# Patient Record
Sex: Female | Born: 1990
Health system: Southern US, Community
[De-identification: ages and names within clinical notes are randomized; demographics above are authoritative.]

## PROBLEM LIST (undated history)

## (undated) DIAGNOSIS — F32A Depression, unspecified: Secondary | ICD-10-CM

## (undated) DIAGNOSIS — I10 Essential (primary) hypertension: Secondary | ICD-10-CM

## (undated) DIAGNOSIS — M549 Dorsalgia, unspecified: Secondary | ICD-10-CM

## (undated) DIAGNOSIS — F909 Attention-deficit hyperactivity disorder, unspecified type: Secondary | ICD-10-CM

## (undated) DIAGNOSIS — F988 Other specified behavioral and emotional disorders with onset usually occurring in childhood and adolescence: Secondary | ICD-10-CM

## (undated) DIAGNOSIS — R7303 Prediabetes: Secondary | ICD-10-CM

## (undated) DIAGNOSIS — R0602 Shortness of breath: Secondary | ICD-10-CM

## (undated) DIAGNOSIS — M7989 Other specified soft tissue disorders: Secondary | ICD-10-CM

## (undated) HISTORY — DX: Other specified soft tissue disorders: M79.89

## (undated) HISTORY — DX: Attention-deficit hyperactivity disorder, unspecified type: F90.9

## (undated) HISTORY — DX: Prediabetes: R73.03

## (undated) HISTORY — DX: Morbid (severe) obesity due to excess calories: E66.01

## (undated) HISTORY — DX: Shortness of breath: R06.02

## (undated) HISTORY — DX: Depression, unspecified: F32.A

## (undated) HISTORY — DX: Essential (primary) hypertension: I10

## (undated) HISTORY — DX: Dorsalgia, unspecified: M54.9

## (undated) HISTORY — DX: Other specified behavioral and emotional disorders with onset usually occurring in childhood and adolescence: F98.8

## (undated) HISTORY — PX: CHOLECYSTECTOMY: SHX55

---

## 2015-11-13 DIAGNOSIS — O4100X0 Oligohydramnios, unspecified trimester, not applicable or unspecified: Secondary | ICD-10-CM

## 2017-12-30 DIAGNOSIS — O139 Gestational [pregnancy-induced] hypertension without significant proteinuria, unspecified trimester: Secondary | ICD-10-CM

## 2018-07-15 ENCOUNTER — Ambulatory Visit (HOSPITAL_COMMUNITY)
Admission: EM | Admit: 2018-07-15 | Discharge: 2018-07-15 | Disposition: A | Discharge status: Home / Self Care | Payer: Medicaid Other | Attending: Internal Medicine | Admitting: Internal Medicine

## 2018-07-15 ENCOUNTER — Encounter (HOSPITAL_COMMUNITY): Payer: Self-pay

## 2018-07-15 DIAGNOSIS — Z3202 Encounter for pregnancy test, result negative: Secondary | ICD-10-CM

## 2018-07-15 DIAGNOSIS — R05 Cough: Secondary | ICD-10-CM | POA: Diagnosis not present

## 2018-07-15 DIAGNOSIS — J019 Acute sinusitis, unspecified: Secondary | ICD-10-CM

## 2018-07-15 LAB — POCT URINALYSIS DIP (DEVICE)
GLUCOSE, UA: NEGATIVE mg/dL
Hgb urine dipstick: NEGATIVE
LEUKOCYTES UA: NEGATIVE
Nitrite: NEGATIVE
PROTEIN: 30 mg/dL — AB
SPECIFIC GRAVITY, URINE: 1.025 (ref 1.005–1.030)
UROBILINOGEN UA: 1 mg/dL (ref 0.0–1.0)
pH: 6 (ref 5.0–8.0)

## 2018-07-15 LAB — POCT PREGNANCY, URINE: PREG TEST UR: NEGATIVE

## 2018-07-15 MED ORDER — AMOXICILLIN-POT CLAVULANATE 875-125 MG PO TABS: 1.0000 | ORAL_TABLET | Freq: Two times a day (BID) | ORAL | 0 refills | Status: DC

## 2018-07-15 MED ORDER — BENZONATATE 200 MG PO CAPS: 200.0000 mg | ORAL_CAPSULE | Freq: Three times a day (TID) | ORAL | 0 refills | Status: AC | PRN

## 2018-07-15 MED ORDER — FLUTICASONE PROPIONATE 50 MCG/ACT NA SUSP: 1.0000 | Freq: Every day | NASAL | 0 refills | Status: DC

## 2018-07-15 MED ORDER — CETIRIZINE HCL 10 MG PO CAPS: 10.0000 mg | ORAL_CAPSULE | Freq: Every day | ORAL | 0 refills | Status: DC

## 2018-07-15 NOTE — Discharge Instructions (Signed)
Please begin taking Augmentin twice daily for the next 10 days; this will treat for sinus infection and ear infection For your congestion please begin taking daily Zyrtec and use Flonase nasal spray 1 to 2 sprays in each nostril daily, I sent these in, but she may also get them over-the-counter if it is cheaper For cough please use Tessalon every 8 hours; you may also try honey mixed in hot tea  Please follow-up if symptoms not improving with treatment, developing fevers, worsening shortness of breath, chest discomfort, difficulty breathing or worsening of symptoms

## 2018-07-15 NOTE — ED Provider Notes (Signed)
[facility]    CSN: [id number] Arrival date & time: 07/15/18  1057     History   Chief Complaint Chief Complaint  Patient presents with  . Cough    HPI Valerie Adkins is a 27 y.o. female no significant past medical history, Patient is presenting with URI symptoms- congestion, cough, sore throat.  Patient has also developed right earache.  Patient's main complaints are cough. Symptoms have been going on for 2 weeks. Patient has tried Mucinex and other cold and flu relief, with minimal relief. Denies fever, nausea, vomiting, diarrhea. Denies shortness of breath and chest pain.  Denies history of asthma or smoking.   HPI  History reviewed. No pertinent past medical history.  There are no active problems to display for this patient.   Past Surgical History:  Procedure Laterality Date  . CESAREAN SECTION    . CHOLECYSTECTOMY      OB History   None      Home Medications    Prior to Admission medications   Medication Sig Start Date End Date Taking? Authorizing Provider  amoxicillin-clavulanate (AUGMENTIN) 875-125 MG tablet Take 1 tablet by mouth every 12 (twelve) hours. 07/15/18   Kashonda Sarkisyan C, PA-C  benzonatate (TESSALON) 200 MG capsule Take 1 capsule (200 mg total) by mouth 3 (three) times daily as needed for up to 7 days for cough. 07/15/18 07/22/18  Laasya Peyton C, PA-C  Cetirizine HCl 10 MG CAPS Take 1 capsule (10 mg total) by mouth daily for 15 days. 07/15/18 07/30/18  Marjean Imperato C, PA-C  fluticasone (FLONASE) 50 MCG/ACT nasal spray Place 1-2 sprays into both nostrils daily for 7 days. 07/15/18 07/22/18  Aarilyn Dye, Junius Creamer, PA-C    Family History Family History  Problem Relation Age of Onset  . Healthy Mother   . Healthy Father     Social History Social History   Tobacco Use  . Smoking status: Never Smoker  . Smokeless tobacco: Never Used  Substance Use Topics  . Alcohol use: Never    Frequency: Never  . Drug use: Not on  file     Allergies   Patient has no known allergies.   Review of Systems Review of Systems  Constitutional: Negative for activity change, appetite change, chills, fatigue and fever.  HENT: Positive for congestion, ear pain and rhinorrhea. Negative for sinus pressure, sore throat and trouble swallowing.   Eyes: Negative for discharge and redness.  Respiratory: Positive for cough. Negative for chest tightness and shortness of breath.   Cardiovascular: Negative for chest pain.  Gastrointestinal: Negative for abdominal pain, diarrhea, nausea and vomiting.  Musculoskeletal: Negative for myalgias.  Skin: Negative for rash.  Neurological: Negative for dizziness, light-headedness and headaches.     Physical Exam Triage Vital Signs ED Triage Vitals  Enc Vitals Group     BP 07/15/18 1148 128/78     Pulse Rate 07/15/18 1148 91     Resp 07/15/18 1148 20     Temp 07/15/18 1148 98 F (36.7 C)     Temp src --      SpO2 07/15/18 1148 99 %     Weight --      Height --      Head Circumference --      Peak Flow --      Pain Score 07/15/18 1147 9     Pain Loc --      Pain Edu? --      Excl. in GC? --  No data found.  Updated Vital Signs BP 128/78   Pulse 91   Temp 98 F (36.7 C)   Resp 20   LMP 05/15/2018   SpO2 99%   Visual Acuity Right Eye Distance:   Left Eye Distance:   Bilateral Distance:    Right Eye Near:   Left Eye Near:    Bilateral Near:     Physical Exam  Constitutional: She appears well-developed and well-nourished. No distress.  HENT:  Head: Normocephalic and atraumatic.  Right TM with effusion/mucus present behind the eardrum, left TM nonerythematous  Oral mucosa pink and moist, no tonsillar enlargement or exudate. Posterior pharynx patent and nonerythematous, no uvula deviation or swelling. Normal phonation.   Eyes: Conjunctivae are normal.  Neck: Neck supple.  Cardiovascular: Normal rate and regular rhythm.  No murmur heard. Pulmonary/Chest:  Effort normal and breath sounds normal. No respiratory distress.  Breathing comfortably at rest, CTABL, no wheezing, rales or other adventitious sounds auscultated   Abdominal: Soft. There is no tenderness.  Musculoskeletal: She exhibits no edema.  Neurological: She is alert.  Skin: Skin is warm and dry.  Psychiatric: She has a normal mood and affect.  Nursing note and vitals reviewed.    UC Treatments / Results  Labs (all labs ordered are listed, but only abnormal results are displayed) Labs Reviewed  POCT URINALYSIS DIP (DEVICE) - Abnormal; Notable for the following components:      Result Value   Bilirubin Urine SMALL (*)    Ketones, ur TRACE (*)    Protein, ur 30 (*)    All other components within normal limits  POCT PREGNANCY, URINE    EKG None  Radiology No results found.  Procedures Procedures (including critical care time)  Medications Ordered in UC Medications - No data to display  Initial Impression / Assessment and Plan / UC Course  I have reviewed the triage vital signs and the nursing notes.  Pertinent labs & imaging results that were available during my care of the patient were reviewed by me and considered in my medical decision making (see chart for details).     Patient with URI symptoms for 2 weeks and effusion on right TM, will treat with Augmentin for 10 days.  Discussed further symptomatic management of congestion with Zyrtec and Flonase.  Tessalon for cough.  Lungs CTA BL.  Vital signs stable.  Continue to monitor symptoms,Discussed strict return precautions. Patient verbalized understanding and is agreeable with plan.  Final Clinical Impressions(s) / UC Diagnoses   Final diagnoses:  Acute sinusitis with symptoms > 10 days     Discharge Instructions     Please begin taking Augmentin twice daily for the next 10 days; this will treat for sinus infection and ear infection For your congestion please begin taking daily Zyrtec and use Flonase  nasal spray 1 to 2 sprays in each nostril daily, I sent these in, but she may also get them over-the-counter if it is cheaper For cough please use Tessalon every 8 hours; you may also try honey mixed in hot tea  Please follow-up if symptoms not improving with treatment, developing fevers, worsening shortness of breath, chest discomfort, difficulty breathing or worsening of symptoms   ED Prescriptions    Medication Sig Dispense Auth. Provider   amoxicillin-clavulanate (AUGMENTIN) 875-125 MG tablet Take 1 tablet by mouth every 12 (twelve) hours. 14 tablet Micki Cassel C, PA-C   Cetirizine HCl 10 MG CAPS Take 1 capsule (10 mg total) by mouth daily  for 15 days. 15 capsule Broderic Bara C, PA-C   fluticasone (FLONASE) 50 MCG/ACT nasal spray Place 1-2 sprays into both nostrils daily for 7 days. 1 g Ytzel Gubler C, PA-C   benzonatate (TESSALON) 200 MG capsule Take 1 capsule (200 mg total) by mouth 3 (three) times daily as needed for up to 7 days for cough. 28 capsule Musab Wingard C, PA-C     Controlled Substance Prescriptions [place] Controlled Substance Registry consulted? Not Applicable   Lew Dawes, [place] 07/15/18 1327

## 2018-07-15 NOTE — ED Triage Notes (Signed)
Pt presents with cough, runny nose, fever and earache x 2 weeks.

## 2018-08-28 ENCOUNTER — Ambulatory Visit: Payer: Medicare Other | Admitting: Obstetrics and Gynecology

## 2018-08-30 ENCOUNTER — Ambulatory Visit: Payer: Medicare Other | Admitting: Obstetrics and Gynecology

## 2018-10-25 ENCOUNTER — Encounter: Payer: Medicare Other | Admitting: Certified Nurse Midwife

## 2018-10-30 ENCOUNTER — Encounter: Payer: Medicare Other | Admitting: Obstetrics & Gynecology

## 2018-10-30 ENCOUNTER — Encounter: Payer: Self-pay | Admitting: Obstetrics & Gynecology

## 2018-10-30 DIAGNOSIS — O0990 Supervision of high risk pregnancy, unspecified, unspecified trimester: Secondary | ICD-10-CM | POA: Insufficient documentation

## 2018-10-30 DIAGNOSIS — O99210 Obesity complicating pregnancy, unspecified trimester: Secondary | ICD-10-CM

## 2018-10-30 NOTE — Progress Notes (Deleted)
   Patient did not show up today for her scheduled appointment.   Brindle Leyba, MD, FACOG Obstetrician & Gynecologist, [facility], [facility]  

## 2018-11-07 ENCOUNTER — Encounter (HOSPITAL_COMMUNITY): Payer: Self-pay | Admitting: Emergency Medicine

## 2018-11-07 ENCOUNTER — Emergency Department (HOSPITAL_COMMUNITY)
Admission: EM | Admit: 2018-11-07 | Discharge: 2018-11-07 | Disposition: A | Discharge status: Home / Self Care | Payer: Medicare Other | Attending: Emergency Medicine | Admitting: Emergency Medicine

## 2018-11-07 ENCOUNTER — Other Ambulatory Visit: Payer: Self-pay

## 2018-11-07 DIAGNOSIS — J069 Acute upper respiratory infection, unspecified: Secondary | ICD-10-CM

## 2018-11-07 DIAGNOSIS — R05 Cough: Secondary | ICD-10-CM | POA: Diagnosis present

## 2018-11-07 MED ORDER — CETIRIZINE HCL 10 MG PO CAPS: 10.0000 mg | ORAL_CAPSULE | Freq: Every day | ORAL | 0 refills | Status: DC

## 2018-11-07 MED ORDER — FLUTICASONE PROPIONATE 50 MCG/ACT NA SUSP: 1.0000 | Freq: Every day | NASAL | 0 refills | Status: DC

## 2018-11-07 NOTE — Discharge Instructions (Addendum)
Saline sinus rinse twice daily.  Use Flonase and Zyrtec as directed.  Recheck with your doctor.

## 2018-11-07 NOTE — ED Provider Notes (Signed)
[facility] EMERGENCY DEPARTMENT Provider Note   CSN: [id number] Arrival date & time: 11/07/18  1422     History   Chief Complaint Chief Complaint  Patient presents with  . URI    HPI Valerie Adkins is a 28 y.o. female.  28 year old female presents with complaint of cough and congestion x1.5 weeks.  Patient has been taking OTC cold medications and missed work and now needs a note to build to return to work.  Patient denies history of asthma or chronic lung disease.  Patient reports recent positive home pregnancy test.  Patient is exposed to her small child has had similar symptoms.  Patient denies fevers.  No other complaints or concerns.     Past Medical History:  Diagnosis Date  . Morbid obesity [facility])     Patient Active Problem List   Diagnosis Date Noted  . Supervision of high-risk pregnancy 10/30/2018  . Maternal morbid obesity, antepartum (HCC) 10/30/2018    Past Surgical History:  Procedure Laterality Date  . CESAREAN SECTION    . CHOLECYSTECTOMY       OB History    Gravida  3   Para  1   Term  1   Preterm      AB      Living  1     SAB      TAB      Ectopic      Multiple      Live Births  1            Home Medications    Prior to Admission medications   Medication Sig Start Date End Date Taking? Authorizing Provider  Cetirizine HCl 10 MG CAPS Take 1 capsule (10 mg total) by mouth daily for 15 days. 11/07/18 11/22/18  Jeannie Fend, PA-C  fluticasone (FLONASE) 50 MCG/ACT nasal spray Place 1-2 sprays into both nostrils daily for 7 days. 11/07/18 11/14/18  Jeannie Fend, PA-C    Family History Family History  Problem Relation Age of Onset  . Healthy Mother   . Healthy Father     Social History Social History   Tobacco Use  . Smoking status: Never Smoker  . Smokeless tobacco: Never Used  Substance Use Topics  . Alcohol use: Never    Frequency: Never  . Drug use: Not on file     Allergies     Patient has no known allergies.   Review of Systems Review of Systems  Constitutional: Positive for chills. Negative for fever.  HENT: Positive for congestion and postnasal drip. Negative for ear pain, facial swelling, sinus pressure, sinus pain, sneezing and sore throat.   Respiratory: Positive for cough. Negative for shortness of breath.   Cardiovascular: Negative for chest pain.  Musculoskeletal: Negative for arthralgias and myalgias.  Skin: Negative for rash and wound.  Allergic/Immunologic: Negative for immunocompromised state.  Neurological: Negative for headaches.  Hematological: Negative for adenopathy.  Psychiatric/Behavioral: Negative for confusion.  All other systems reviewed and are negative.    Physical Exam Updated Vital Signs Pulse (!) 103   Temp 98.2 F (36.8 C) (Oral)   Resp 17   LMP 05/15/2018   SpO2 98%   Physical Exam Vitals signs and nursing note reviewed.  Constitutional:      General: She is not in acute distress.    Appearance: She is well-developed. She is not diaphoretic.  HENT:     Head: Normocephalic and atraumatic.     Right Ear: Tympanic  membrane and ear canal normal.     Left Ear: Tympanic membrane and ear canal normal.     Nose: Mucosal edema and congestion present. No rhinorrhea.     Mouth/Throat:     Mouth: Mucous membranes are moist.     Pharynx: No oropharyngeal exudate or posterior oropharyngeal erythema.  Eyes:     Conjunctiva/sclera: Conjunctivae normal.  Neck:     Musculoskeletal: Neck supple.  Cardiovascular:     Rate and Rhythm: Normal rate and regular rhythm.     Pulses: Normal pulses.     Heart sounds: Normal heart sounds. No murmur.  Pulmonary:     Effort: Pulmonary effort is normal.     Breath sounds: Normal breath sounds.  Lymphadenopathy:     Cervical: No cervical adenopathy.  Skin:    General: Skin is warm and dry.     Findings: No erythema or rash.  Neurological:     Mental Status: She is alert and oriented  to person, place, and time.  Psychiatric:        Behavior: Behavior normal.      ED Treatments / Results  Labs (all labs ordered are listed, but only abnormal results are displayed) Labs Reviewed - No data to display  EKG None  Radiology No results found.  Procedures Procedures (including critical care time)  Medications Ordered in ED Medications - No data to display   Initial Impression / Assessment and Plan / ED Course  I have reviewed the triage vital signs and the nursing notes.  Pertinent labs & imaging results that were available during my care of the patient were reviewed by me and considered in my medical decision making (see chart for details).  Clinical Course as of Nov 07 1534  Wed Nov 07, 2018  651 28 year old female with URI symptoms x1.5 weeks.  Patient is feeling better needs a note to return to work.  Note was given for patient to return to work tomorrow, recommend recheck with her PCP, unclear on medications with her OB or check with pharmacist, may use Flonase and Zyrtec, saline sinus rinse.   [LM]    Clinical Course User Index [LM] Jeannie Fend, PA-C   Final Clinical Impressions(s) / ED Diagnoses   Final diagnoses:  Viral upper respiratory tract infection    ED Discharge Orders         Ordered    Cetirizine HCl 10 MG CAPS  Daily     11/07/18 1535    fluticasone (FLONASE) 50 MCG/ACT nasal spray  Daily     11/07/18 1535           Alden Hipp 11/07/18 1536    Tilden Fossa, MD 11/07/18 1907

## 2018-11-07 NOTE — ED Triage Notes (Signed)
Pt with cough and cold symptoms. She reports that she is pregnant confirmed by home test, unsure of lmp or how far along she is. Denies fevers but has been around a sick child.

## 2018-11-14 ENCOUNTER — Encounter: Payer: Self-pay | Admitting: Obstetrics and Gynecology

## 2018-11-14 ENCOUNTER — Ambulatory Visit (INDEPENDENT_AMBULATORY_CARE_PROVIDER_SITE_OTHER): Payer: Medicare Other | Admitting: Obstetrics and Gynecology

## 2018-11-14 ENCOUNTER — Other Ambulatory Visit (HOSPITAL_COMMUNITY)
Admission: RE | Admit: 2018-11-14 | Discharge: 2018-11-14 | Disposition: A | Discharge status: Home / Self Care | Payer: Medicare Other | Source: Ambulatory Visit | Attending: Certified Nurse Midwife | Admitting: Certified Nurse Midwife

## 2018-11-14 DIAGNOSIS — Z3481 Encounter for supervision of other normal pregnancy, first trimester: Secondary | ICD-10-CM

## 2018-11-14 DIAGNOSIS — O0990 Supervision of high risk pregnancy, unspecified, unspecified trimester: Secondary | ICD-10-CM

## 2018-11-14 DIAGNOSIS — Z98891 History of uterine scar from previous surgery: Secondary | ICD-10-CM

## 2018-11-14 DIAGNOSIS — Z8759 Personal history of other complications of pregnancy, childbirth and the puerperium: Secondary | ICD-10-CM | POA: Insufficient documentation

## 2018-11-14 DIAGNOSIS — O0992 Supervision of high risk pregnancy, unspecified, second trimester: Secondary | ICD-10-CM

## 2018-11-14 MED ORDER — ASPIRIN EC 81 MG PO TBEC: 81.0000 mg | DELAYED_RELEASE_TABLET | Freq: Every day | ORAL | 2 refills | Status: DC

## 2018-11-14 NOTE — Patient Instructions (Signed)
First Trimester of Pregnancy  The first trimester of pregnancy is from week 1 until the end of week 13 (months 1 through 3). A week after a sperm fertilizes an egg, the egg will implant on the wall of the uterus. This embryo will begin to develop into a baby. Genes from you and your partner will form the baby. The female genes will determine whether the baby will be a boy or a girl. At 6-8 weeks, the eyes and face will be formed, and the heartbeat can be seen on ultrasound. At the end of 12 weeks, all the baby's organs will be formed.  Now that you are pregnant, you will want to do everything you can to have a healthy baby. Two of the most important things are to get good prenatal care and to follow your health care provider's instructions. Prenatal care is all the medical care you receive before the baby's birth. This care will help prevent, find, and treat any problems during the pregnancy and childbirth.  Body changes during your first trimester  Your body goes through many changes during pregnancy. The changes vary from woman to woman.   You may gain or lose a couple of pounds at first.   You may feel sick to your stomach (nauseous) and you may throw up (vomit). If the vomiting is uncontrollable, call your health care provider.   You may tire easily.   You may develop headaches that can be relieved by medicines. All medicines should be approved by your health care provider.   You may urinate more often. Painful urination may mean you have a bladder infection.   You may develop heartburn as a result of your pregnancy.   You may develop constipation because certain hormones are causing the muscles that push stool through your intestines to slow down.   You may develop hemorrhoids or swollen veins (varicose veins).   Your breasts may begin to grow larger and become tender. Your nipples may stick out more, and the tissue that surrounds them (areola) may become darker.   Your gums may bleed and may be  sensitive to brushing and flossing.   Dark spots or blotches (chloasma, mask of pregnancy) may develop on your face. This will likely fade after the baby is born.   Your menstrual periods will stop.   You may have a loss of appetite.   You may develop cravings for certain kinds of food.   You may have changes in your emotions from day to day, such as being excited to be pregnant or being concerned that something may go wrong with the pregnancy and baby.   You may have more vivid and strange dreams.   You may have changes in your hair. These can include thickening of your hair, rapid growth, and changes in texture. Some women also have hair loss during or after pregnancy, or hair that feels dry or thin. Your hair will most likely return to normal after your baby is born.  What to expect at prenatal visits  During a routine prenatal visit:   You will be weighed to make sure you and the baby are growing normally.   Your blood pressure will be taken.   Your abdomen will be measured to track your baby's growth.   The fetal heartbeat will be listened to between weeks 10 and 14 of your pregnancy.   Test results from any previous visits will be discussed.  Your health care provider may ask you:     How you are feeling.   If you are feeling the baby move.   If you have had any abnormal symptoms, such as leaking fluid, bleeding, severe headaches, or abdominal cramping.   If you are using any tobacco products, including cigarettes, chewing tobacco, and electronic cigarettes.   If you have any questions.  Other tests that may be performed during your first trimester include:   Blood tests to find your blood type and to check for the presence of any previous infections. The tests will also be used to check for low iron levels (anemia) and protein on red blood cells (Rh antibodies). Depending on your risk factors, or if you previously had diabetes during pregnancy, you may have tests to check for high blood sugar  that affects pregnant women (gestational diabetes).   Urine tests to check for infections, diabetes, or protein in the urine.   An ultrasound to confirm the proper growth and development of the baby.   Fetal screens for spinal cord problems (spina bifida) and Down syndrome.   HIV (human immunodeficiency virus) testing. Routine prenatal testing includes screening for HIV, unless you choose not to have this test.   You may need other tests to make sure you and the baby are doing well.  Follow these instructions at home:  Medicines   Follow your health care provider's instructions regarding medicine use. Specific medicines may be either safe or unsafe to take during pregnancy.   Take a prenatal vitamin that contains at least 600 micrograms (mcg) of folic acid.   If you develop constipation, try taking a stool softener if your health care provider approves.  Eating and drinking     Eat a balanced diet that includes fresh fruits and vegetables, whole grains, good sources of protein such as meat, eggs, or tofu, and low-fat dairy. Your health care provider will help you determine the amount of weight gain that is right for you.   Avoid raw meat and uncooked cheese. These carry germs that can cause birth defects in the baby.   Eating four or five small meals rather than three large meals a day may help relieve nausea and vomiting. If you start to feel nauseous, eating a few soda crackers can be helpful. Drinking liquids between meals, instead of during meals, also seems to help ease nausea and vomiting.   Limit foods that are high in fat and processed sugars, such as fried and sweet foods.   To prevent constipation:  ? Eat foods that are high in fiber, such as fresh fruits and vegetables, whole grains, and beans.  ? Drink enough fluid to keep your urine clear or pale yellow.  Activity   Exercise only as directed by your health care provider. Most women can continue their usual exercise routine during  pregnancy. Try to exercise for 30 minutes at least 5 days a week. Exercising will help you:  ? Control your weight.  ? Stay in shape.  ? Be prepared for labor and delivery.   Experiencing pain or cramping in the lower abdomen or lower back is a good sign that you should stop exercising. Check with your health care provider before continuing with normal exercises.   Try to avoid standing for long periods of time. Move your legs often if you must stand in one place for a long time.   Avoid heavy lifting.   Wear low-heeled shoes and practice good posture.   You may continue to have sex unless your health care   provider tells you not to.  Relieving pain and discomfort   Wear a good support bra to relieve breast tenderness.   Take warm sitz baths to soothe any pain or discomfort caused by hemorrhoids. Use hemorrhoid cream if your health care provider approves.   Rest with your legs elevated if you have leg cramps or low back pain.   If you develop varicose veins in your legs, wear support hose. Elevate your feet for 15 minutes, 3-4 times a day. Limit salt in your diet.  Prenatal care   Schedule your prenatal visits by the twelfth week of pregnancy. They are usually scheduled monthly at first, then more often in the last 2 months before delivery.   Write down your questions. Take them to your prenatal visits.   Keep all your prenatal visits as told by your health care provider. This is important.  Safety   Wear your seat belt at all times when driving.   Make a list of emergency phone numbers, including numbers for family, friends, the hospital, and police and fire departments.  General instructions   Ask your health care provider for a referral to a local prenatal education class. Begin classes no later than the beginning of month 6 of your pregnancy.   Ask for help if you have counseling or nutritional needs during pregnancy. Your health care provider can offer advice or refer you to specialists for help  with various needs.   Do not use hot tubs, steam rooms, or saunas.   Do not douche or use tampons or scented sanitary pads.   Do not cross your legs for long periods of time.   Avoid cat litter boxes and soil used by cats. These carry germs that can cause birth defects in the baby and possibly loss of the fetus by miscarriage or stillbirth.   Avoid all smoking, herbs, alcohol, and medicines not prescribed by your health care provider. Chemicals in these products affect the formation and growth of the baby.   Do not use any products that contain nicotine or tobacco, such as cigarettes and e-cigarettes. If you need help quitting, ask your health care provider. You may receive counseling support and other resources to help you quit.   Schedule a dentist appointment. At home, brush your teeth with a soft toothbrush and be gentle when you floss.  Contact a health care provider if:   You have dizziness.   You have mild pelvic cramps, pelvic pressure, or nagging pain in the abdominal area.   You have persistent nausea, vomiting, or diarrhea.   You have a bad smelling vaginal discharge.   You have pain when you urinate.   You notice increased swelling in your face, hands, legs, or ankles.   You are exposed to fifth disease or chickenpox.   You are exposed to German measles (rubella) and have never had it.  Get help right away if:   You have a fever.   You are leaking fluid from your vagina.   You have spotting or bleeding from your vagina.   You have severe abdominal cramping or pain.   You have rapid weight gain or loss.   You vomit blood or material that looks like coffee grounds.   You develop a severe headache.   You have shortness of breath.   You have any kind of trauma, such as from a fall or a car accident.  Summary   The first trimester of pregnancy is from week 1 until   the end of week 13 (months 1 through 3).   Your body goes through many changes during pregnancy. The changes vary from  woman to woman.   You will have routine prenatal visits. During those visits, your health care provider will examine you, discuss any test results you may have, and talk with you about how you are feeling.  This information is not intended to replace advice given to you by your health care provider. Make sure you discuss any questions you have with your health care provider.  Document Released: 09/20/2001 Document Revised: 09/07/2016 Document Reviewed: 09/07/2016  [employer]vier Interactive Patient Education  2019 [employer]

## 2018-11-14 NOTE — Progress Notes (Signed)
Pt is here for initial OB visit. Pt had annual and pap done in 11/19- normal.

## 2018-11-14 NOTE — Progress Notes (Signed)
Subjective:  Valerie Adkins is a 28 y.o. G3P2002 at 38w2dbeing seen today for her first OB visit. Uncertain LMP. No chronic medical problems or medications. H/O GHTN with pregnancies. H/O c section x 2.   She is currently monitored for the following issues for this high-risk pregnancy and has Supervision of high-risk pregnancy; Maternal morbid obesity, antepartum ([place]; History of cesarean section; and History of gestational hypertension on their problem list.  Patient reports no complaints.  Contractions: Not present. Vag. Bleeding: None.   . Denies leaking of fluid.   The following portions of the patient's history were reviewed and updated as appropriate: allergies, current medications, past family history, past medical history, past social history, past surgical history and problem list. Problem list updated.  Objective:   Vitals:   11/14/18 0919 11/14/18 0920  BP: 110/76   Pulse: 91   Weight: (!) 340 lb (154.2 kg)   Height:  _0  (1.626 m)    Fetal Status: Fetal Heart Rate (bpm): 148         General:  Alert, oriented and cooperative. Patient is in no acute distress.  Skin: Skin is warm and dry. No rash noted.   Cardiovascular: Normal heart rate noted  Respiratory: Normal respiratory effort, no problems with respiration noted  Abdomen: Soft, gravid, appropriate for gestational age. Pain/Pressure: Absent     Pelvic:  Cervical exam deferred        Extremities: Normal range of motion.  Edema: None  Mental Status: Normal mood and affect. Normal behavior. Normal judgment and thought content.   Urinalysis:      Assessment and Plan:  Pregnancy: G3P2002 at 127w2d1. Supervision of high risk pregnancy, antepartum Prenatal labs and care reviewed with pt - Obstetric Panel, Including HIV - Culture, OB Urine - Genetic Screening - Hemoglobinopathy evaluation - Cystic Fibrosis Mutation 97 - SMN1 COPY NUMBER ANALYSIS (SMA Carrier Screen) - Urine cytology ancillary only([facility]) - [place] Comp Less 14 Wks; Future - [place] Transvaginal; Future  2. History of cesarean section Repeat at 39 weeks Desires BTL   3. History of gestational hypertension BP satble Start BASA qd Reviewed with pt - Protein / creatinine ratio, urine - Comp Met (CMET) - aspirin EC 81 MG tablet; Take 1 tablet (81 mg total) by mouth daily. Take after 12 weeks for prevention of preeclampsia later in pregnancy  Dispense: 300 tablet; Refill: 2  Preterm labor symptoms and general obstetric precautions including but not limited to vaginal bleeding, contractions, leaking of fluid and fetal movement were reviewed in detail with the patient. Please refer to After Visit Summary for other counseling recommendations.  Return in about 4 weeks (around 12/12/2018) for OB visit.   ErChancy MilroyMD

## 2018-11-15 LAB — PROTEIN / CREATININE RATIO, URINE
CREATININE, UR: 190.7 mg/dL
PROTEIN UR: 41.7 mg/dL
PROTEIN/CREAT RATIO: 219 mg/g{creat} — AB (ref 0–200)

## 2018-11-16 ENCOUNTER — Ambulatory Visit (HOSPITAL_COMMUNITY): Payer: Medicare Other

## 2018-11-17 LAB — URINE CYTOLOGY ANCILLARY ONLY
Chlamydia: NEGATIVE
NEISSERIA GONORRHEA: NEGATIVE

## 2018-11-18 LAB — CULTURE, OB URINE

## 2018-11-18 LAB — URINE CULTURE, OB REFLEX

## 2018-11-24 LAB — OBSTETRIC PANEL, INCLUDING HIV
ANTIBODY SCREEN: NEGATIVE
BASOS ABS: 0 10*3/uL (ref 0.0–0.2)
BASOS: 0 %
EOS (ABSOLUTE): 0.1 10*3/uL (ref 0.0–0.4)
Eos: 1 %
HIV SCREEN 4TH GENERATION: NONREACTIVE
Hematocrit: 37.2 % (ref 34.0–46.6)
Hemoglobin: 11.6 g/dL (ref 11.1–15.9)
Hepatitis B Surface Ag: NEGATIVE
Immature Grans (Abs): 0 10*3/uL (ref 0.0–0.1)
Immature Granulocytes: 0 %
LYMPHS ABS: 1.7 10*3/uL (ref 0.7–3.1)
Lymphs: 29 %
MCH: 24.1 pg — AB (ref 26.6–33.0)
MCHC: 31.2 g/dL — AB (ref 31.5–35.7)
MCV: 77 fL — AB (ref 79–97)
MONOS ABS: 0.3 10*3/uL (ref 0.1–0.9)
Monocytes: 5 %
NEUTROS ABS: 3.8 10*3/uL (ref 1.4–7.0)
Neutrophils: 65 %
PLATELETS: 233 10*3/uL (ref 150–450)
RBC: 4.82 x10E6/uL (ref 3.77–5.28)
RDW: 16.4 % — AB (ref 11.7–15.4)
RPR Ser Ql: NONREACTIVE
Rh Factor: POSITIVE
Rubella Antibodies, IGG: 6.67 index (ref >0.99)
WBC: 5.9 10*3/uL (ref 3.4–10.8)

## 2018-11-24 LAB — SMN1 COPY NUMBER ANALYSIS (SMA CARRIER SCREENING)

## 2018-11-24 LAB — COMPREHENSIVE METABOLIC PANEL
ALK PHOS: 63 IU/L (ref 39–117)
ALT: 26 IU/L (ref 0–32)
AST: 15 IU/L (ref 0–40)
Albumin/Globulin Ratio: 1 — ABNORMAL LOW (ref 1.2–2.2)
Albumin: 3.7 g/dL — ABNORMAL LOW (ref 3.9–5.0)
BUN/Creatinine Ratio: 17 (ref 9–23)
BUN: 8 mg/dL (ref 6–20)
Bilirubin Total: 0.2 mg/dL (ref 0.0–1.2)
CO2: 22 mmol/L (ref 20–29)
CREATININE: 0.46 mg/dL — AB (ref 0.57–1.00)
Calcium: 9.3 mg/dL (ref 8.7–10.2)
Chloride: 104 mmol/L (ref 96–106)
GFR calc Af Amer: 158 mL/min/{1.73_m2} (ref >59)
GFR calc non Af Amer: 137 mL/min/{1.73_m2} (ref >59)
GLOBULIN, TOTAL: 3.8 g/dL (ref 1.5–4.5)
Glucose: 105 mg/dL — ABNORMAL HIGH (ref 65–99)
Potassium: 4.2 mmol/L (ref 3.5–5.2)
SODIUM: 140 mmol/L (ref 134–144)
Total Protein: 7.5 g/dL (ref 6.0–8.5)

## 2018-11-24 LAB — HEMOGLOBINOPATHY EVALUATION
HEMOGLOBIN F QUANTITATION: 0 % (ref 0.0–2.0)
HGB A: 97.8 % (ref 96.4–98.8)
HGB C: 0 %
HGB S: 0 %
HGB VARIANT: 0 %
Hemoglobin A2 Quantitation: 2.2 % (ref 1.8–3.2)

## 2018-11-24 LAB — CYSTIC FIBROSIS MUTATION 97: GENE DIS ANAL CARRIER INTERP BLD/T-IMP: NOT DETECTED

## 2018-11-28 ENCOUNTER — Ambulatory Visit (HOSPITAL_COMMUNITY)
Admission: RE | Admit: 2018-11-28 | Discharge: 2018-11-28 | Disposition: A | Discharge status: Home / Self Care | Payer: Medicare Other | Source: Ambulatory Visit | Attending: Obstetrics and Gynecology | Admitting: Obstetrics and Gynecology

## 2018-11-28 DIAGNOSIS — O0990 Supervision of high risk pregnancy, unspecified, unspecified trimester: Secondary | ICD-10-CM | POA: Diagnosis present

## 2018-12-03 ENCOUNTER — Telehealth: Payer: Self-pay

## 2018-12-03 NOTE — Telephone Encounter (Signed)
U/S verified IUP at [redacted] weeks GA. Thanks Casimiro Needle

## 2018-12-03 NOTE — Telephone Encounter (Signed)
Patient called in requesting [place] results. Advised patient message would be routed to provider to review results - once results have been reviewed she will be contacted by our office. Patient stated she understood - gave her future appt info as well.

## 2018-12-03 NOTE — Telephone Encounter (Signed)
Called patient  - per provider advised patient of [place] results; EDD.  Patient stated she understood and had no further questions.

## 2018-12-12 ENCOUNTER — Encounter: Payer: Medicare Other | Admitting: Obstetrics and Gynecology

## 2018-12-19 ENCOUNTER — Encounter: Payer: Self-pay | Admitting: Obstetrics & Gynecology

## 2018-12-19 ENCOUNTER — Ambulatory Visit (INDEPENDENT_AMBULATORY_CARE_PROVIDER_SITE_OTHER): Payer: Medicare Other | Admitting: Obstetrics & Gynecology

## 2018-12-19 ENCOUNTER — Other Ambulatory Visit: Payer: Self-pay

## 2018-12-19 VITALS — BP 118/79 | HR 98 | Wt 340.0 lb

## 2018-12-19 DIAGNOSIS — O0990 Supervision of high risk pregnancy, unspecified, unspecified trimester: Secondary | ICD-10-CM

## 2018-12-19 DIAGNOSIS — O0992 Supervision of high risk pregnancy, unspecified, second trimester: Secondary | ICD-10-CM

## 2018-12-19 DIAGNOSIS — Z8759 Personal history of other complications of pregnancy, childbirth and the puerperium: Secondary | ICD-10-CM

## 2018-12-19 DIAGNOSIS — Z3A17 17 weeks gestation of pregnancy: Secondary | ICD-10-CM

## 2018-12-19 NOTE — Progress Notes (Signed)
   PRENATAL VISIT NOTE  Subjective:  Valerie Adkins is a 28 y.o. G3P2002 at [redacted]w[redacted]d being seen today for ongoing prenatal care.  She is currently monitored for the following issues for this high-risk pregnancy and has Supervision of high-risk pregnancy; Maternal morbid obesity, antepartum (HCC); History of cesarean section; and History of gestational hypertension on their problem list.  Patient reports no complaints.  Contractions: Not present.  .  Movement: Present. Denies leaking of fluid.   The following portions of the patient's history were reviewed and updated as appropriate: allergies, current medications, past family history, past medical history, past social history, past surgical history and problem list.   Objective:   Vitals:   12/19/18 0952  BP: 118/79  Pulse: 98  Weight: (!) 154.2 kg    Fetal Status: Fetal Heart Rate (bpm): 142   Movement: Present     General:  Alert, oriented and cooperative. Patient is in no acute distress.  Skin: Skin is warm and dry. No rash noted.   Cardiovascular: Normal heart rate noted  Respiratory: Normal respiratory effort, no problems with respiration noted  Abdomen: Soft, gravid, appropriate for gestational age.  Pain/Pressure: Absent     Pelvic: Cervical exam deferred        Extremities: Normal range of motion.  Edema: Trace  Mental Status: Normal mood and affect. Normal behavior. Normal judgment and thought content.   Assessment and Plan:  Pregnancy: G3P2002 at [redacted]w[redacted]d 1. Supervision of high risk pregnancy, antepartum Screen for ONTD - [place] MFM OB DETAIL +14 WK; Future - AFP, Serum, Open Spina Bifida  2. History of gestational hypertension BP normal  Preterm labor symptoms and general obstetric precautions including but not limited to vaginal bleeding, contractions, leaking of fluid and fetal movement were reviewed in detail with the patient. Please refer to After Visit Summary for other counseling recommendations.   Return  in about 4 weeks (around 01/16/2019).  No future appointments.  Scheryl Darter, MD

## 2018-12-19 NOTE — Patient Instructions (Signed)

## 2018-12-21 LAB — AFP, SERUM, OPEN SPINA BIFIDA
AFP MoM: 0.57
AFP Value: 24.4 ng/mL
GEST. AGE ON COLLECTION DATE: 17.4 wk
Maternal Age At EDD: 28.3 yr
OSBR RISK 1 IN: 10000
TEST RESULTS AFP: NEGATIVE
WEIGHT: 154 [lb_av]

## 2018-12-25 ENCOUNTER — Encounter (HOSPITAL_COMMUNITY): Payer: Self-pay

## 2019-01-02 ENCOUNTER — Other Ambulatory Visit (HOSPITAL_COMMUNITY): Payer: Self-pay | Admitting: *Deleted

## 2019-01-02 ENCOUNTER — Other Ambulatory Visit: Payer: Self-pay

## 2019-01-02 ENCOUNTER — Ambulatory Visit (HOSPITAL_COMMUNITY): Payer: Medicare Other | Admitting: *Deleted

## 2019-01-02 ENCOUNTER — Ambulatory Visit (HOSPITAL_COMMUNITY)
Admission: RE | Admit: 2019-01-02 | Discharge: 2019-01-02 | Disposition: A | Discharge status: Home / Self Care | Payer: Medicare Other | Source: Ambulatory Visit | Attending: Obstetrics & Gynecology | Admitting: Obstetrics & Gynecology

## 2019-01-02 ENCOUNTER — Encounter (HOSPITAL_COMMUNITY): Payer: Self-pay

## 2019-01-02 VITALS — BP 130/85 | HR 92 | Temp 98.0°F

## 2019-01-02 DIAGNOSIS — Z3A19 19 weeks gestation of pregnancy: Secondary | ICD-10-CM

## 2019-01-02 DIAGNOSIS — O99213 Obesity complicating pregnancy, third trimester: Secondary | ICD-10-CM

## 2019-01-02 DIAGNOSIS — Z363 Encounter for antenatal screening for malformations: Secondary | ICD-10-CM

## 2019-01-02 DIAGNOSIS — O34219 Maternal care for unspecified type scar from previous cesarean delivery: Secondary | ICD-10-CM | POA: Diagnosis not present

## 2019-01-02 DIAGNOSIS — O0990 Supervision of high risk pregnancy, unspecified, unspecified trimester: Secondary | ICD-10-CM | POA: Insufficient documentation

## 2019-01-02 DIAGNOSIS — O99212 Obesity complicating pregnancy, second trimester: Secondary | ICD-10-CM | POA: Diagnosis not present

## 2019-01-02 DIAGNOSIS — O10012 Pre-existing essential hypertension complicating pregnancy, second trimester: Secondary | ICD-10-CM

## 2019-01-17 ENCOUNTER — Ambulatory Visit (INDEPENDENT_AMBULATORY_CARE_PROVIDER_SITE_OTHER): Payer: Medicare Other | Admitting: Obstetrics & Gynecology

## 2019-01-17 ENCOUNTER — Other Ambulatory Visit: Payer: Self-pay

## 2019-01-17 VITALS — BP 128/76 | HR 102

## 2019-01-17 DIAGNOSIS — Z3A21 21 weeks gestation of pregnancy: Secondary | ICD-10-CM

## 2019-01-17 DIAGNOSIS — O0992 Supervision of high risk pregnancy, unspecified, second trimester: Secondary | ICD-10-CM

## 2019-01-17 DIAGNOSIS — O0990 Supervision of high risk pregnancy, unspecified, unspecified trimester: Secondary | ICD-10-CM

## 2019-01-17 DIAGNOSIS — Z8759 Personal history of other complications of pregnancy, childbirth and the puerperium: Secondary | ICD-10-CM

## 2019-01-17 MED FILL — PRENATAL VITAMINS TABLET: 28-0.8 | 100 days supply | Qty: 100 | Fill #0

## 2019-01-17 NOTE — Patient Instructions (Signed)

## 2019-01-17 NOTE — Progress Notes (Signed)
S/w pt via tele visit, reports fetal movement, denies pain. Pt has BP cuff at home, took BP while on the phone, 128/76, pulse 102.

## 2019-01-17 NOTE — Progress Notes (Signed)
   TELEHEALTH VIRTUAL OBSTETRICS VISIT ENCOUNTER NOTE  I connected with Valerie Adkins on 01/17/19 at  9:45 AM EDT by telephone at home and verified that I am speaking with the correct person using two identifiers.   I discussed the limitations, risks, security and privacy concerns of performing an evaluation and management service by telephone and the availability of in person appointments. I also discussed with the patient that there may be a patient responsible charge related to this service. The patient expressed understanding and agreed to proceed.  Subjective:  Valerie Adkins is a 28 y.o. G3P2002 at [redacted]w[redacted]d being followed for ongoing prenatal care.  She is currently monitored for the following issues for this high-risk pregnancy and has Supervision of high-risk pregnancy; Maternal morbid obesity, antepartum (HCC); History of cesarean section; and History of gestational hypertension on their problem list.  Patient reports no complaints. Reports fetal movement. Denies any contractions, bleeding or leaking of fluid.   The following portions of the patient's history were reviewed and updated as appropriate: allergies, current medications, past family history, past medical history, past social history, past surgical history and problem list.   Objective:   General:  Alert, oriented and cooperative.   Mental Status: Normal mood and affect perceived. Normal judgment and thought content.  Rest of physical exam deferred due to type of encounter  Assessment and Plan:  Pregnancy: G3P2002 at [redacted]w[redacted]d 1. Supervision of high risk pregnancy, antepartum Doing well  2. History of gestational hypertension BP is ok today, will use Bab[employer] app after this to record  Preterm labor symptoms and general obstetric precautions including but not limited to vaginal bleeding, contractions, leaking of fluid and fetal movement were reviewed in detail with the patient.  I discussed the  assessment and treatment plan with the patient. The patient was provided an opportunity to ask questions and all were answered. The patient agreed with the plan and demonstrated an understanding of the instructions. The patient was advised to call back or seek an in-person office evaluation/go to MAU at [facility] for any urgent or concerning symptoms. Please refer to After Visit Summary for other counseling recommendations.   I provided 10 minutes of non-face-to-face time during this encounter.  Return in about 6 weeks (around 02/28/2019) for 2 hr in person.  Future Appointments  Date Time Provider Department Center  03/13/2019  9:00 AM [facility]-MFC NURSE WH-MFC MFC-US  03/13/2019  9:00 AM WH-MFC [place] 3 WH-MFCUS MFC-US    Scheryl Darter, MD Center for [facility], [facility]

## 2019-02-04 ENCOUNTER — Inpatient Hospital Stay (HOSPITAL_COMMUNITY)
Admission: AD | Admit: 2019-02-04 | Discharge: 2019-02-04 | Disposition: A | Discharge status: Home / Self Care | Payer: Medicare Other | Attending: Obstetrics and Gynecology | Admitting: Obstetrics and Gynecology

## 2019-02-04 ENCOUNTER — Encounter (HOSPITAL_COMMUNITY): Payer: Self-pay | Admitting: *Deleted

## 2019-02-04 ENCOUNTER — Other Ambulatory Visit: Payer: Self-pay

## 2019-02-04 ENCOUNTER — Telehealth: Payer: Self-pay | Admitting: *Deleted

## 2019-02-04 DIAGNOSIS — Z7982 Long term (current) use of aspirin: Secondary | ICD-10-CM | POA: Diagnosis not present

## 2019-02-04 DIAGNOSIS — O26892 Other specified pregnancy related conditions, second trimester: Secondary | ICD-10-CM | POA: Diagnosis not present

## 2019-02-04 DIAGNOSIS — Z91030 Bee allergy status: Secondary | ICD-10-CM | POA: Diagnosis not present

## 2019-02-04 DIAGNOSIS — O34219 Maternal care for unspecified type scar from previous cesarean delivery: Secondary | ICD-10-CM | POA: Insufficient documentation

## 2019-02-04 DIAGNOSIS — O162 Unspecified maternal hypertension, second trimester: Secondary | ICD-10-CM | POA: Insufficient documentation

## 2019-02-04 DIAGNOSIS — Z79899 Other long term (current) drug therapy: Secondary | ICD-10-CM | POA: Diagnosis not present

## 2019-02-04 DIAGNOSIS — M549 Dorsalgia, unspecified: Secondary | ICD-10-CM | POA: Insufficient documentation

## 2019-02-04 DIAGNOSIS — Z3A24 24 weeks gestation of pregnancy: Secondary | ICD-10-CM

## 2019-02-04 DIAGNOSIS — Z7952 Long term (current) use of systemic steroids: Secondary | ICD-10-CM | POA: Diagnosis not present

## 2019-02-04 DIAGNOSIS — Z9049 Acquired absence of other specified parts of digestive tract: Secondary | ICD-10-CM | POA: Insufficient documentation

## 2019-02-04 DIAGNOSIS — O9989 Other specified diseases and conditions complicating pregnancy, childbirth and the puerperium: Secondary | ICD-10-CM | POA: Insufficient documentation

## 2019-02-04 DIAGNOSIS — O99212 Obesity complicating pregnancy, second trimester: Secondary | ICD-10-CM | POA: Insufficient documentation

## 2019-02-04 DIAGNOSIS — R102 Pelvic and perineal pain: Secondary | ICD-10-CM | POA: Insufficient documentation

## 2019-02-04 DIAGNOSIS — O26899 Other specified pregnancy related conditions, unspecified trimester: Secondary | ICD-10-CM

## 2019-02-04 LAB — URINALYSIS, ROUTINE W REFLEX MICROSCOPIC
Bilirubin Urine: NEGATIVE
Glucose, UA: NEGATIVE mg/dL
Hgb urine dipstick: NEGATIVE
Ketones, ur: NEGATIVE mg/dL
Leukocytes,Ua: NEGATIVE
Nitrite: NEGATIVE
Protein, ur: 30 mg/dL — AB
Specific Gravity, Urine: 1.034 — ABNORMAL HIGH (ref 1.005–1.030)
pH: 5 (ref 5.0–8.0)

## 2019-02-04 MED ORDER — COMFORT FIT MATERNITY SUPP SM MISC: 1.0000 [IU] | Freq: Every day | 0 refills | Status: DC | PRN

## 2019-02-04 NOTE — MAU Provider Note (Signed)
Chief Complaint:  Back Pain and Abdominal Pain   First Provider Initiated Contact with Patient 02/04/19 2004     HPI: Valerie Adkins is a 28 y.o. G3P2002 at 3336w2d who presents to maternity admissions reporting abdominal pain. Currently not having pain. 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 Denies dysuria, vaginal discharge, vaginal bleeding, or LOF. Normal fetal movement.   Location: abdomen Quality: sharp Severity: 0(currently no pain)/10 in pain scale Duration: days Timing: intermittent Modifying factors: occurs when walking & lifting heavy objects Associated signs and symptoms: none   Past Medical History:  Diagnosis Date  . Hypertension   . Morbid obesity (HCC)    OB History  Gravida Para Term Preterm AB Living  3 2 2     2   SAB TAB Ectopic Multiple Live Births          2    # Outcome Date GA Lbr Len/2nd Weight Sex Delivery Anes PTL Lv  3 Current           2 Term 12/30/17    M CS-LTranv   LIV     Complications: Fetal Intolerance, Gestational hypertension  1 Term 11/13/15    M CS-LTranv   LIV     Complications: Oligohydramnios   Past Surgical History:  Procedure Laterality Date  . CESAREAN SECTION    . CHOLECYSTECTOMY     Family History  Adopted: Yes  Problem Relation Age of Onset  . Healthy Mother   . Healthy Father    Social History   Tobacco Use  . Smoking status: Never Smoker  . Smokeless tobacco: Never Used  Substance Use Topics  . Alcohol use: Never    Frequency: Never  . Drug use: Never   Allergies  Allergen Reactions  . Bee Venom Swelling   No medications prior to admission.    I have reviewed patient's Past Medical Hx, Surgical Hx, Family Hx, Social Hx, medications and allergies.   ROS:  Review of Systems  Constitutional: Negative.   Gastrointestinal: Positive for abdominal pain  (none currently).  Genitourinary: Negative.     Physical Exam   Patient Vitals for the past 24 hrs:  BP Temp Temp src Pulse Resp SpO2 Height Weight  02/04/19 2048 110/67 - - 97 16 - - -  02/04/19 2031 133/81 - - 87 - - - -  02/04/19 2028 131/81 - - 86 - - - -  02/04/19 2014 136/86 - - 84 - - - -  02/04/19 1839 (!) 145/86 98.3 F (36.8 C) - 93 (!) 22 95 % 5\' 4"  (1.626 m) (!) 155.6 kg  02/04/19 1800 - 97.9 F (36.6 C) Oral - - - - -    Constitutional: Well-developed, well-nourished female in no acute distress.  Cardiovascular: normal rate & rhythm, no murmur Respiratory: normal effort, lung sounds clear throughout GI: Abd soft, non-tender, gravid appropriate for gestational age. Pos BS x 4 MS: Extremities nontender, no edema, normal ROM Neurologic: Alert and oriented x 4.  GU:      Pelvic: NEFG, physiologic discharge, no blood, cervix clean.   Dilation: Closed Effacement (%): Thick Cervical Position: Middle Exam by:: Judeth HornErin Zynasia Burklow NP  NST:  Baseline: 145 bpm, Variability: Good {> 6 bpm), Accelerations: Non-reactive but appropriate for gestational age, Decelerations: Absent and no contractions   Labs: Results for  orders placed or performed during the hospital encounter of 02/04/19 (from the past 24 hour(s))  Urinalysis, Routine w reflex microscopic     Status: Abnormal   Collection Time: 02/04/19  8:44 PM  Result Value Ref Range   Color, Urine YELLOW YELLOW   APPearance CLEAR CLEAR   Specific Gravity, Urine 1.034 (H) 1.005 - 1.030   pH 5.0 5.0 - 8.0   Glucose, UA NEGATIVE NEGATIVE mg/dL   Hgb urine dipstick NEGATIVE NEGATIVE   Bilirubin Urine NEGATIVE NEGATIVE   Ketones, ur NEGATIVE NEGATIVE mg/dL   Protein, ur 30 (A) NEGATIVE mg/dL   Nitrite NEGATIVE NEGATIVE   Leukocytes,Ua NEGATIVE NEGATIVE   RBC / HPF 6-10 0 - 5 RBC/hpf   WBC, UA 0-5 0 - 5 WBC/hpf   Bacteria, UA RARE (A) NONE SEEN   Squamous Epithelial / LPF 0-5 0 - 5   Mucus PRESENT    Ca Oxalate Crys, UA  PRESENT     Imaging:  No results found.  MAU Course: Orders Placed This Encounter  Procedures  . Urinalysis, Routine w reflex microscopic  . Discharge patient   Meds ordered this encounter  Medications  . Elastic Bandages & Supports (COMFORT FIT MATERNITY SUPP SM) MISC    Sig: 1 Units by Does not apply route daily as needed.    Dispense:  1 each    Refill:  0    Order Specific Question:   Supervising Provider    Answer:   Conan Bowens [[id number]]    MDM: Fetal tracing appropriate for gestation & no contractions. Abdominal soft & non tender & patient currently without pain. Cervix closed/thick/firm.   Triage BP elevated. All repeats normal & pt without symptoms. Hx of GHTN in previous pregnancy & denies CHTN. Baseline labs done in office.  Description of pain consistent with round ligament pain. Will give rx & info for maternity support belt. Will also give work restriction note as she should not be lifting patients at this stage of her pregnancy.   Assessment: 1. Pain of round ligament during pregnancy   2. [redacted] weeks gestation of pregnancy     Plan: Discharge home in stable condition.  Preterm Labor precautions and fetal kick counts Rx maternity support belt   Allergies as of 02/04/2019      Reactions   Bee Venom Swelling      Medication List    TAKE these medications   aspirin EC 81 MG tablet Take 1 tablet (81 mg total) by mouth daily. Take after 12 weeks for prevention of preeclampsia later in pregnancy   Cetirizine HCl 10 MG Caps Take 1 capsule (10 mg total) by mouth daily for 15 days.   Comfort Fit Maternity Supp Sm Misc 1 Units by Does not apply route daily as needed.   fluticasone 50 MCG/ACT nasal spray Commonly known as:  FLONASE Place 1-2 sprays into both nostrils daily for 7 days.   Prenatal Vitamins 28-0.8 MG Tabs Take by mouth.       Judeth Horn, NP 02/04/2019 9:21 PM

## 2019-02-04 NOTE — Telephone Encounter (Signed)
Pt called to office asking to be able to take some time off work due to pain she is having. 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 Pt was advised she should not be lifting patients during pregnancy. Pt states she hurt her back last night at work while lifting pt from floor.  Pt is now on way to hospital for eval. Pt advised to ask for restriction letter while there. Pt also advised once she is home and can speak with employer to obtain accomodation papers for our office to complete. Pt advised to call office later this week and make aware of work situation.

## 2019-02-04 NOTE — Discharge Instructions (Signed)

## 2019-02-04 NOTE — MAU Note (Signed)
Pt presents to MAU with complaints of lower abdominal pain. 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 Denies any vaginal bleeding

## 2019-02-28 ENCOUNTER — Encounter: Payer: Self-pay | Admitting: Obstetrics and Gynecology

## 2019-02-28 ENCOUNTER — Other Ambulatory Visit: Payer: Medicare Other

## 2019-02-28 ENCOUNTER — Other Ambulatory Visit: Payer: Self-pay

## 2019-02-28 ENCOUNTER — Ambulatory Visit (INDEPENDENT_AMBULATORY_CARE_PROVIDER_SITE_OTHER): Payer: Medicare Other | Admitting: Obstetrics and Gynecology

## 2019-02-28 VITALS — BP 139/82 | HR 99 | Temp 97.3°F | Wt 340.0 lb

## 2019-02-28 DIAGNOSIS — Z3A27 27 weeks gestation of pregnancy: Secondary | ICD-10-CM

## 2019-02-28 DIAGNOSIS — O0990 Supervision of high risk pregnancy, unspecified, unspecified trimester: Secondary | ICD-10-CM

## 2019-02-28 DIAGNOSIS — Z98891 History of uterine scar from previous surgery: Secondary | ICD-10-CM

## 2019-02-28 DIAGNOSIS — O0992 Supervision of high risk pregnancy, unspecified, second trimester: Secondary | ICD-10-CM

## 2019-02-28 DIAGNOSIS — Z23 Encounter for immunization: Secondary | ICD-10-CM | POA: Diagnosis not present

## 2019-02-28 DIAGNOSIS — Z8759 Personal history of other complications of pregnancy, childbirth and the puerperium: Secondary | ICD-10-CM

## 2019-02-28 DIAGNOSIS — O99212 Obesity complicating pregnancy, second trimester: Secondary | ICD-10-CM

## 2019-02-28 NOTE — Patient Instructions (Signed)

## 2019-02-28 NOTE — Progress Notes (Addendum)
   PRENATAL VISIT NOTE  Subjective:  Valerie Adkins is a 28 y.o. G3P2002 at [redacted]w[redacted]d being seen today for ongoing prenatal care.  She is currently monitored for the following issues for this high-risk pregnancy and has Supervision of high-risk pregnancy; Maternal morbid obesity, antepartum (HCC); History of cesarean section; and History of gestational hypertension on their problem list.  Patient reports no complaints.  Contractions: Not present. Vag. Bleeding: None.  Movement: Present. Denies leaking of fluid.   The following portions of the patient's history were reviewed and updated as appropriate: allergies, current medications, past family history, past medical history, past social history, past surgical history and problem list.   Objective:   Vitals:   02/28/19 0938  BP: 139/82  Pulse: 99  Temp: (!) 97.3 F (36.3 C)  Weight: (!) 340 lb (154.2 kg)    Fetal Status: Fetal Heart Rate (bpm): 140   Movement: Present     General:  Alert, oriented and cooperative. Patient is in no acute distress.  Skin: Skin is warm and dry. No rash noted.   Cardiovascular: Normal heart rate noted  Respiratory: Normal respiratory effort, no problems with respiration noted  Abdomen: Soft, gravid, appropriate for gestational age.  Pain/Pressure: Absent     Pelvic: Cervical exam deferred        Extremities: Normal range of motion.  Edema: None  Mental Status: Normal mood and affect. Normal behavior. Normal judgment and thought content.   Assessment and Plan:  Pregnancy: G3P2002 at [redacted]w[redacted]d  1. Supervision of high risk pregnancy, antepartum - Glucose Tolerance, 2 Hours w/1 Hour - CBC - RPR - HIV Antibody (routine testing w rflx) - originally requested BTL, however after counseling, does not want permanent sterilization, wants Liletta with CS  2. Need for Tdap vaccination - Tdap vaccine greater than or equal to 7yo IM  3. History of cesarean section Will need RCS, message sent to scheduler  today  4. History of gestational hypertension BP normal today but trending up overall  5. Maternal morbid obesity, antepartum (HCC) Has f/u growth 6/3  Preterm labor symptoms and general obstetric precautions including but not limited to vaginal bleeding, contractions, leaking of fluid and fetal movement were reviewed in detail with the patient. Please refer to After Visit Summary for other counseling recommendations.   Return in about 2 weeks (around 03/14/2019) for OB visit (MD), virtual.  Future Appointments  Date Time Provider Department Center  03/13/2019  9:00 AM [facility] NURSE [facility] MFC-US  03/13/2019  9:00 AM [facility]-MFC [place] 3 WH-MFCUS MFC-US  03/14/2019 11:15 AM Conan Bowens, MD [facility] None    Conan Bowens, MD

## 2019-03-01 LAB — CBC
Hematocrit: 35.5 % (ref 34.0–46.6)
Hemoglobin: 11.8 g/dL (ref 11.1–15.9)
MCH: 25.5 pg — ABNORMAL LOW (ref 26.6–33.0)
MCHC: 33.2 g/dL (ref 31.5–35.7)
MCV: 77 fL — ABNORMAL LOW (ref 79–97)
Platelets: 248 10*3/uL (ref 150–450)
RBC: 4.63 x10E6/uL (ref 3.77–5.28)
RDW: 14.4 % (ref 11.7–15.4)
WBC: 7.2 10*3/uL (ref 3.4–10.8)

## 2019-03-01 LAB — HIV ANTIBODY (ROUTINE TESTING W REFLEX): HIV Screen 4th Generation wRfx: NONREACTIVE

## 2019-03-01 LAB — GLUCOSE TOLERANCE, 2 HOURS W/ 1HR
Glucose, 1 hour: 137 mg/dL (ref 65–179)
Glucose, 2 hour: 114 mg/dL (ref 65–152)
Glucose, Fasting: 88 mg/dL (ref 65–91)

## 2019-03-01 LAB — RPR: RPR Ser Ql: NONREACTIVE

## 2019-03-11 ENCOUNTER — Telehealth: Payer: Self-pay

## 2019-03-11 NOTE — Telephone Encounter (Signed)
Follow up call to patient regarding sx's  Pt states she is still in discomfort.  CNM agreed with plan for pt to go to MAU. Pt made aware again to go to MAU pt states she was laying down and voiced understanding.

## 2019-03-11 NOTE — Telephone Encounter (Signed)
Return call to patient regarding discomfort back pain and pain in rib cage Pt states discomfort is mostly on her right side. However patient c/o of pain when breathing  Pt states she has had sx's since Saturday. Pt states no relief with tylenol  Using heating pad, changing positions Even warm bath nothing is giving her relief. Pt advised on stretches and maternity belt  Pt has Rx for belt. I told patient if she is having a hard time breathing she needs to go to MAU for an evaluation Pt states she has her small child at this time and FOB was at work. Pt still advised to go to MAU and continue comfort measures.  Pt voiced understanding.

## 2019-03-11 NOTE — Telephone Encounter (Signed)
Chart reviewed for nurse visit/phone call. Agree with plan of care.   Hurshel Party, CNM 03/11/2019 3:29 PM

## 2019-03-13 ENCOUNTER — Ambulatory Visit (HOSPITAL_COMMUNITY): Payer: Medicare Other | Admitting: *Deleted

## 2019-03-13 ENCOUNTER — Encounter (HOSPITAL_COMMUNITY): Payer: Self-pay

## 2019-03-13 ENCOUNTER — Other Ambulatory Visit: Payer: Self-pay

## 2019-03-13 ENCOUNTER — Ambulatory Visit (HOSPITAL_COMMUNITY)
Admission: RE | Admit: 2019-03-13 | Discharge: 2019-03-13 | Disposition: A | Discharge status: Home / Self Care | Payer: Medicare Other | Source: Ambulatory Visit | Attending: Obstetrics and Gynecology | Admitting: Obstetrics and Gynecology

## 2019-03-13 ENCOUNTER — Other Ambulatory Visit (HOSPITAL_COMMUNITY): Payer: Self-pay | Admitting: *Deleted

## 2019-03-13 VITALS — BP 130/75 | HR 98 | Temp 98.3°F

## 2019-03-13 DIAGNOSIS — Z362 Encounter for other antenatal screening follow-up: Secondary | ICD-10-CM | POA: Diagnosis not present

## 2019-03-13 DIAGNOSIS — O99213 Obesity complicating pregnancy, third trimester: Secondary | ICD-10-CM | POA: Insufficient documentation

## 2019-03-13 DIAGNOSIS — O0990 Supervision of high risk pregnancy, unspecified, unspecified trimester: Secondary | ICD-10-CM | POA: Diagnosis present

## 2019-03-13 DIAGNOSIS — O34219 Maternal care for unspecified type scar from previous cesarean delivery: Secondary | ICD-10-CM

## 2019-03-13 DIAGNOSIS — O10913 Unspecified pre-existing hypertension complicating pregnancy, third trimester: Secondary | ICD-10-CM

## 2019-03-13 DIAGNOSIS — O10013 Pre-existing essential hypertension complicating pregnancy, third trimester: Secondary | ICD-10-CM

## 2019-03-13 DIAGNOSIS — Z3A29 29 weeks gestation of pregnancy: Secondary | ICD-10-CM

## 2019-03-14 ENCOUNTER — Telehealth (INDEPENDENT_AMBULATORY_CARE_PROVIDER_SITE_OTHER): Payer: Medicare Other | Admitting: Obstetrics

## 2019-03-14 ENCOUNTER — Encounter: Payer: Self-pay | Admitting: Obstetrics

## 2019-03-14 VITALS — BP 82/55 | HR 73

## 2019-03-14 DIAGNOSIS — Z3A29 29 weeks gestation of pregnancy: Secondary | ICD-10-CM

## 2019-03-14 DIAGNOSIS — Z8759 Personal history of other complications of pregnancy, childbirth and the puerperium: Secondary | ICD-10-CM

## 2019-03-14 DIAGNOSIS — Z98891 History of uterine scar from previous surgery: Secondary | ICD-10-CM

## 2019-03-14 DIAGNOSIS — O99213 Obesity complicating pregnancy, third trimester: Secondary | ICD-10-CM

## 2019-03-14 DIAGNOSIS — Z3483 Encounter for supervision of other normal pregnancy, third trimester: Secondary | ICD-10-CM

## 2019-03-14 DIAGNOSIS — O99210 Obesity complicating pregnancy, unspecified trimester: Secondary | ICD-10-CM

## 2019-03-14 DIAGNOSIS — Z3480 Encounter for supervision of other normal pregnancy, unspecified trimester: Secondary | ICD-10-CM

## 2019-03-14 NOTE — Progress Notes (Signed)
Pt is on the phone preparing for virtual visit with provider. [redacted]w[redacted]d.  

## 2019-03-14 NOTE — Progress Notes (Signed)
TELEHEALTH OBSTETRICS PRENATAL VIRTUAL VIDEO VISIT ENCOUNTER NOTE  Provider location: [facility] [employer] [place]   I connected with Valerie Adkins on 03/14/19 at 11:15 AM EDT by WebEx OB MyChart Video Encounter at home and verified that I am speaking with the correct person using two identifiers.   I discussed the limitations, risks, security and privacy concerns of performing an evaluation and management service by telephone and the availability of in person appointments. I also discussed with the patient that there may be a patient responsible charge related to this service. The patient expressed understanding and agreed to proceed. Subjective:  Valerie Adkins is a 28 y.o. G3P2002 at [redacted]w[redacted]d being seen today for ongoing prenatal care.  She is currently monitored for the following issues for this low-risk pregnancy and has Supervision of high-risk pregnancy; Maternal morbid obesity, antepartum (HCC); History of cesarean section; and History of gestational hypertension on their problem list.  Patient reports no complaints.  Contractions: Not present. Vag. Bleeding: None.  Movement: Present. Denies any leaking of fluid.   The following portions of the patient's history were reviewed and updated as appropriate: allergies, current medications, past family history, past medical history, past social history, past surgical history and problem list.   Objective:   Vitals:   03/14/19 1102  BP: (!) 82/55  Pulse: 73    Fetal Status:     Movement: Present     General:  Alert, oriented and cooperative. Patient is in no acute distress.  Respiratory: Normal respiratory effort, no problems with respiration noted  Mental Status: Normal mood and affect. Normal behavior. Normal judgment and thought content.  Rest of physical exam deferred due to type of encounter  Imaging: [place] Mfm Ob Follow Up  Result Date: 03/13/2019  ----------------------------------------------------------------------  OBSTETRICS REPORT                       (Signed Final 03/13/2019 10:17 am) ---------------------------------------------------------------------- Patient Info  ID #:       [id number]                          D.O.B.:  [birth year] (28 yrs)  Name:       Valerie Adkins-                  Visit Date: 03/13/2019 09:24 am              Adkins ---------------------------------------------------------------------- Performed By  Performed By:     Sandi Mealy        Ref. Address:     [address]                                                             [place], [place]  [place]  Attending:        Sander Nephew      [facility]                    MD                                       Fetal Care  Referred By:      Woodroe Mode                    MD ---------------------------------------------------------------------- Orders   #  Description                          Code         Ordered By   1  [place] MFM OB FOLLOW UP                  [id number]     RAVI [facility]  ----------------------------------------------------------------------   #  Order #                    Accession #                 Episode #   1  [id number]                  [id number]                  [id number]  ---------------------------------------------------------------------- Indications   [redacted] weeks gestation of pregnancy                Z3A.29   Maternal morbid obesity (BMI 50)               O99.210 E66.01   Hypertension - Chronic/Pre-existing (ASA)      O10.019   (normal BPs @ PN's x 2)   Previous cesarean delivery, antepartum x 2     O34.219   Encounter for other antenatal screening        Z36.2   follow-up (Low Risk NIPS)  ---------------------------------------------------------------------- Vital Signs  Weight  (lb): 340                               Height:        5'4"  BMI:         58.35 ---------------------------------------------------------------------- Fetal Evaluation  Num Of Fetuses:         1  Fetal Heart Rate(bpm):  131  Cardiac Activity:       Observed  Presentation:           Transverse, head to maternal left  Placenta:               Posterior Fundal  P. Cord Insertion:      Visualized  Amniotic Fluid  AFI FV:      Within normal limits  AFI Sum(cm)     %Tile       Largest Pocket(cm)  15.85           57          5.17  RUQ(cm)       RLQ(cm)       LUQ(cm)        LLQ(cm)  4.16          5.17          3.26           3.26 ---------------------------------------------------------------------- Biometry  BPD:        72  mm     G. Age:  28w 6d         19  %    CI:        71.25   %    70 - 86                                                          FL/HC:      20.6   %    19.2 - 21.4  HC:      271.7  mm     G. Age:  29w 5d         20  %    HC/AC:      1.00        0.99 - 1.21  AC:      272.7  mm     G. Age:  31w 3d         89  %    FL/BPD:     77.8   %    71 - 87  FL:         56  mm     G. Age:  29w 4d         32  %    FL/AC:      20.5   %    20 - 24  HUM:      50.7  mm     G. Age:  29w 5d         51  %  LV:        1.8  mm  Est. FW:    1565  gm      3 lb 7 oz     68  % ---------------------------------------------------------------------- OB History  Gravidity:    3         Term:   2        Prem:   0        SAB:   0  TOP:          0       Ectopic:  0        Living: 2 ---------------------------------------------------------------------- Gestational Age  LMP:           31w 2d        Date:  08/06/18                 EDD:   05/13/19  U/S Today:     29w 6d                                        EDD:   05/23/19  Best:          29w 4d     Det. ByMarcella Dubs         EDD:   05/25/19                                      (  11/28/18) ---------------------------------------------------------------------- Anatomy  Cranium:                Appears normal         Aortic Arch:            Previously seen  Cavum:                 Appears normal         Ductal Arch:            Previously seen  Ventricles:            Appears normal         Diaphragm:              Appears normal  Choroid Plexus:        Previously seen        Stomach:                Appears normal, left                                                                        sided  Cerebellum:            Previously seen        Abdomen:                Appears normal  Posterior Fossa:       Previously seen        Abdominal Wall:         Previously seen  Nuchal Fold:           Previously seen        Cord Vessels:           Not well visualized  Face:                  Orbits and profile     Kidneys:                Appear normal                         previously seen  Lips:                  Previously seen        Bladder:                Appears normal  Thoracic:              Appears normal         Spine:                  Previously seen  Heart:                 Appears normal         Upper Extremities:      Previously seen                         (4CH, axis, and si  RVOT:                  Previously seen        Lower Extremities:  Previously seen  LVOT:                  Previously seen  Other:  Fetus appears to be female. Heels visualized. Technically difficult due          to maternal habitus and fetal position. ---------------------------------------------------------------------- Impression  Normal interval growth.  Chronic Hypertension  BMI >40 ---------------------------------------------------------------------- Recommendations  Follow up growth in 4-6 weeks. ----------------------------------------------------------------------               Lin Landsmanorenthian Booker, MD Electronically Signed Final Report   03/13/2019 10:17 am ----------------------------------------------------------------------   Assessment and Plan:  Pregnancy: W1X9147G3P2002 at 1861w5d 1. Supervision of other normal  pregnancy, antepartum  2. History of cesarean section  3. Maternal morbid obesity, antepartum (HCC)   Preterm labor symptoms and general obstetric precautions including but not limited to vaginal bleeding, contractions, leaking of fluid and fetal movement were reviewed in detail with the patient. I discussed the assessment and treatment plan with the patient. The patient was provided an opportunity to ask questions and all were answered. The patient agreed with the plan and demonstrated an understanding of the instructions. The patient was advised to call back or seek an in-person office evaluation/[facility] for any urgent or concerning symptoms. Please refer to After Visit Summary for other counseling recommendations.   I provided 10 minutes of face-to-face time during this encounter.  Return in about 1 week (around 03/21/2019) for MyChart.  Future Appointments  Date Time Provider Department Center  04/17/2019  9:30 AM WH-MFC NURSE WH-MFC MFC-US  04/17/2019  9:30 AM WH-MFC US 1 WH-MFCUS MFC-US    Coral Ceoharles Harper, MD [facility], [facility] 03-14-2019

## 2019-03-21 ENCOUNTER — Telehealth (INDEPENDENT_AMBULATORY_CARE_PROVIDER_SITE_OTHER): Payer: Medicare Other | Admitting: Obstetrics

## 2019-03-21 ENCOUNTER — Encounter: Payer: Self-pay | Admitting: Obstetrics

## 2019-03-21 DIAGNOSIS — Z8759 Personal history of other complications of pregnancy, childbirth and the puerperium: Secondary | ICD-10-CM

## 2019-03-21 DIAGNOSIS — Z3480 Encounter for supervision of other normal pregnancy, unspecified trimester: Secondary | ICD-10-CM

## 2019-03-21 DIAGNOSIS — O09293 Supervision of pregnancy with other poor reproductive or obstetric history, third trimester: Secondary | ICD-10-CM

## 2019-03-21 DIAGNOSIS — Z3A30 30 weeks gestation of pregnancy: Secondary | ICD-10-CM

## 2019-03-21 DIAGNOSIS — Z98891 History of uterine scar from previous surgery: Secondary | ICD-10-CM

## 2019-03-21 NOTE — Progress Notes (Signed)
TELEHEALTH OBSTETRICS PRENATAL VIRTUAL VIDEO VISIT ENCOUNTER NOTE  [facility] [place]   I connected with Krystel AdkinsWashington on 03/21/19 at 11:00 AM EDT by WebEx OB MyChart Video Encounter at home and verified that I am speaking with the correct person using two identifiers.   I discussed the limitations, risks, security and privacy concerns of performing an evaluation and management service by telephone and the availability of in person appointments. I also discussed with the patient that there may be a patient responsible charge related to this service. The patient expressed understanding and agreed to proceed. Subjective:  Raymondo BandShatley AdkinsWashington is a 28 y.o. G3P2002 at 3165w5d being seen today for ongoing prenatal care.  She is currently monitored for the following issues for this low-risk pregnancy and has Supervision of high-risk pregnancy; Maternal morbid obesity, antepartum (HCC); History of cesarean section; and History of gestational hypertension on their problem list.  Patient reports backache.  Contractions: Not present. Vag. Bleeding: None.  Movement: Present. Denies any leaking of fluid.   The following portions of the patient's history were reviewed and updated as appropriate: allergies, current medications, past family history, past medical history, past social history, past surgical history and problem list.   Objective:  There were no vitals filed for this visit.  Fetal Status:     Movement: Present     General:  Alert, oriented and cooperative. Patient is in no acute distress.  Respiratory: Normal respiratory effort, no problems with respiration noted  Mental Status: Normal mood and affect. Normal behavior. Normal judgment and thought content.  Rest of physical exam deferred due to type of encounter  Imaging: [place] Mfm Ob Follow Up  Result Date: 03/13/2019 ----------------------------------------------------------------------   OBSTETRICS REPORT                       (Signed Final 03/13/2019 10:17 am) ---------------------------------------------------------------------- Patient Info  ID #:       [id number]                          D.O.B.:  10/16/1990 (28 yrs)  Name:       Valerie CrapeSHATLEY SMITH-                  Visit Date: 03/13/2019 09:24 am              [place] ---------------------------------------------------------------------- Performed By  Performed By:     Sandi MealyJovancia Adrien        Ref. Address:     [address]                    [address]                                                             [address]                                                             [place], [place]  [address]  Attending:        Lin Landsmanorenthian Booker      Location:         [facility]   [address]                 MD                                       Fetal Care  Referred By:      Adam PhenixJAMES G ARNOLD                    MD ---------------------------------------------------------------------- Orders   #  Description                          Code         Ordered By   1  US MFM OB FOLLOW UP                  [id number]     [facility]  ----------------------------------------------------------------------   #  Order #                    Accession #                 Episode #   1  [id number]                  [id number]                  [id number]  ---------------------------------------------------------------------- Indications   [redacted] weeks gestation of pregnancy                Z3A.29   Maternal morbid obesity (BMI 55)               O99.210 E66.01   Hypertension - Chronic/Pre-existing (ASA)      O10.019   (normal BPs @ PN's x 2)   Previous cesarean delivery, antepartum x 2     O34.219   Encounter for other antenatal screening        Z36.2   follow-up (Low Risk NIPS)  ---------------------------------------------------------------------- Vital Signs  Weight (lb): 340                               Height:        5'4"  BMI:          58.35 ---------------------------------------------------------------------- Fetal Evaluation  Num Of Fetuses:         1  Fetal Heart Rate(bpm):  131  Cardiac Activity:       Observed  Presentation:           Transverse, head to maternal left  Placenta:               Posterior Fundal  P. Cord Insertion:      Visualized  Amniotic Fluid  AFI FV:      Within normal limits  AFI Sum(cm)     %Tile       Largest Pocket(cm)  15.85           57          5.17  RUQ(cm)       RLQ(cm)       LUQ(cm)        LLQ(cm)  4.16          5.17          3.26           3.26 ---------------------------------------------------------------------- Biometry  BPD:        72  mm     G. Age:  28w 6d         19  %    CI:        71.25   %    70 - 86                                                          FL/HC:      20.6   %    19.2 - 21.4  HC:      271.7  mm     G. Age:  29w 5d         20  %    HC/AC:      1.00        0.99 - 1.21  AC:      272.7  mm     G. Age:  31w 3d         89  %    FL/BPD:     77.8   %    71 - 87  FL:         56  mm     G. Age:  29w 4d         32  %    FL/AC:      20.5   %    20 - 24  HUM:      50.7  mm     G. Age:  29w 5d         51  %  LV:        1.8  mm  Est. FW:    1565  gm      3 lb 7 oz     68  % ---------------------------------------------------------------------- OB History  Gravidity:    3         Term:   2        Prem:   0        SAB:   0  TOP:          0       Ectopic:  0        Living: 2 ---------------------------------------------------------------------- Gestational Age  LMP:           31w 2d        Date:  08/06/18                 EDD:   05/13/19  U/S Today:     29w 6d                                        EDD:   05/23/19  Best:          29w 4d     Det. ByMarcella Dubs         EDD:   05/25/19                                      (  11/28/18) ---------------------------------------------------------------------- Anatomy  Cranium:               Appears normal         Aortic Arch:            Previously seen   Cavum:                 Appears normal         Ductal Arch:            Previously seen  Ventricles:            Appears normal         Diaphragm:              Appears normal  Choroid Plexus:        Previously seen        Stomach:                Appears normal, left                                                                        sided  Cerebellum:            Previously seen        Abdomen:                Appears normal  Posterior Fossa:       Previously seen        Abdominal Wall:         Previously seen  Nuchal Fold:           Previously seen        Cord Vessels:           Not well visualized  Face:                  Orbits and profile     Kidneys:                Appear normal                         previously seen  Lips:                  Previously seen        Bladder:                Appears normal  Thoracic:              Appears normal         Spine:                  Previously seen  Heart:                 Appears normal         Upper Extremities:      Previously seen                         (4CH, axis, and si  RVOT:                  Previously seen        Lower Extremities:  Previously seen  LVOT:                  Previously seen  Other:  Fetus appears to be female. Heels visualized. Technically difficult due          to maternal habitus and fetal position. ---------------------------------------------------------------------- Impression  Normal interval growth.  Chronic Hypertension  BMI >40 ---------------------------------------------------------------------- Recommendations  Follow up growth in 4-6 weeks. ----------------------------------------------------------------------               Lin Landsmanorenthian Booker, MD Electronically Signed Final Report   03/13/2019 10:17 am ----------------------------------------------------------------------   Assessment and Plan:  Pregnancy: [id number] at 7171w5d There are no diagnoses linked to this encounter. Preterm labor symptoms and general obstetric precautions  including but not limited to vaginal bleeding, contractions, leaking of fluid and fetal movement were reviewed in detail with the patient. I discussed the assessment and treatment plan with the patient. The patient was provided an opportunity to ask questions and all were answered. The patient agreed with the plan and demonstrated an understanding of the instructions. The patient was advised to call back or seek an in-person office evaluation/[facility] for any urgent or concerning symptoms. Please refer to After Visit Summary for other counseling recommendations.   I provided 10 minutes of face-to-face time during this encounter.  Return in about 2 weeks (around 04/04/2019) for [facility].  Future Appointments  Date Time Provider Department Center  04/17/2019  9:30 AM WH-MFC NURSE WH-MFC MFC-US  04/17/2019  9:30 AM WH-MFC US 1 WH-MFCUS MFC-US    Coral Ceoharles , MD [facility] 03-21-2019

## 2019-04-04 ENCOUNTER — Telehealth (INDEPENDENT_AMBULATORY_CARE_PROVIDER_SITE_OTHER): Payer: Medicare Other | Admitting: Obstetrics

## 2019-04-04 ENCOUNTER — Encounter: Payer: Self-pay | Admitting: Obstetrics

## 2019-04-04 VITALS — BP 124/80 | HR 104

## 2019-04-04 DIAGNOSIS — Z98891 History of uterine scar from previous surgery: Secondary | ICD-10-CM

## 2019-04-04 DIAGNOSIS — O0993 Supervision of high risk pregnancy, unspecified, third trimester: Secondary | ICD-10-CM

## 2019-04-04 DIAGNOSIS — O99213 Obesity complicating pregnancy, third trimester: Secondary | ICD-10-CM

## 2019-04-04 DIAGNOSIS — O0990 Supervision of high risk pregnancy, unspecified, unspecified trimester: Secondary | ICD-10-CM

## 2019-04-04 DIAGNOSIS — Z3A32 32 weeks gestation of pregnancy: Secondary | ICD-10-CM

## 2019-04-04 DIAGNOSIS — Z8759 Personal history of other complications of pregnancy, childbirth and the puerperium: Secondary | ICD-10-CM

## 2019-04-04 DIAGNOSIS — O09293 Supervision of pregnancy with other poor reproductive or obstetric history, third trimester: Secondary | ICD-10-CM

## 2019-04-04 NOTE — Progress Notes (Signed)
S/w patient for mychart visit. Pt reports fetal movement, denies pain.

## 2019-04-04 NOTE — Progress Notes (Signed)
TELEHEALTH OBSTETRICS PRENATAL VIRTUAL VIDEO VISIT ENCOUNTER NOTE  Provider [facility]: [facility]   I connected with Valerie Adkins on 04/04/19 at  9:00 AM EDT by WebEx OB MyChart Video Encounter at home and verified that I am speaking with the correct person using two identifiers.   I discussed the limitations, risks, security and privacy concerns of performing an evaluation and management service by telephone and the availability of in person appointments. I also discussed with the patient that there may be a patient responsible charge related to this service. The patient expressed understanding and agreed to proceed. Subjective:  Valerie Adkins is a 28 y.o. G3P2002 at 1883w5d being seen today for ongoing prenatal care.  She is currently monitored for the following issues for this high-risk pregnancy and has Supervision of high-risk pregnancy; Maternal morbid obesity, antepartum (HCC); History of cesarean section; and History of gestational hypertension on their problem list.  Patient reports no complaints.  Contractions: Not present. Vag. Bleeding: None.  Movement: Present. Denies any leaking of fluid.   The following portions of the patient's history were reviewed and updated as appropriate: allergies, current medications, past family history, past medical history, past social history, past surgical history and problem list.   Objective:   Vitals:   04/04/19 0921  BP: 124/80  Pulse: (!) 104    Fetal Status:     Movement: Present     General:  Alert, oriented and cooperative. Patient is in no acute distress.  Respiratory: Normal respiratory effort, no problems with respiration noted  Mental Status: Normal mood and affect. Normal behavior. Normal judgment and thought content.  Rest of physical exam deferred due to type of encounter  Imaging: [place] Mfm Ob Follow Up  Result Date: 03/13/2019  ----------------------------------------------------------------------  OBSTETRICS REPORT                       (Signed Final 03/13/2019 10:17 am) ---------------------------------------------------------------------- Patient Info  ID #:       [id number]                          D.O.B.:  [birth year] (28 yrs)  Name:       Valerie Adkins-                  Visit Date: 03/13/2019 09:24 am              [place] ---------------------------------------------------------------------- Performed By  Performed By:     Sandi MealyJovancia Adrien        Ref. Address:     [address]                    [address]                                                             [address]                                                             [place], [place]  [place]  Attending:        Sander Nephew      [facility]  Referred By:      Woodroe Mode                    MD ---------------------------------------------------------------------- Orders   #  Description                          Code         Ordered By   1  [place] MFM OB FOLLOW UP                  [id number]     [facility]  ----------------------------------------------------------------------   #  Order #                    Accession #                 Episode #   1  [id number]                  [id number]                  [id number]  ---------------------------------------------------------------------- Indications   [redacted] weeks gestation of pregnancy                Z3A.29   Maternal morbid obesity (BMI 50)               O99.210 E66.01   Hypertension - Chronic/Pre-existing (ASA)      O10.019   (normal BPs @ PN's x 2)   Previous cesarean delivery, antepartum x 2     O34.219   Encounter for other antenatal screening        Z36.2   follow-up (Low Risk NIPS)  ---------------------------------------------------------------------- Vital Signs  Weight  (lb): 340                               Height:        5'4"  BMI:         58.35 ---------------------------------------------------------------------- Fetal Evaluation  Num Of Fetuses:         1  Fetal Heart Rate(bpm):  131  Cardiac Activity:       Observed  Presentation:           Transverse, head to maternal left  Placenta:               Posterior Fundal  P. Cord Insertion:      Visualized  Amniotic Fluid  AFI FV:      Within normal limits  AFI Sum(cm)     %Tile       Largest Pocket(cm)  15.85           57          5.17  RUQ(cm)       RLQ(cm)       LUQ(cm)        LLQ(cm)  4.16          5.17          3.26           3.26 ---------------------------------------------------------------------- Biometry  BPD:        72  mm     G. Age:  28w 6d         19  %    CI:        71.25   %    70 - 86                                                          FL/HC:      20.6   %    19.2 - 21.4  HC:      271.7  mm     G. Age:  29w 5d         20  %    HC/AC:      1.00        0.99 - 1.21  AC:      272.7  mm     G. Age:  31w 3d         89  %    FL/BPD:     77.8   %    71 - 87  FL:         56  mm     G. Age:  29w 4d         32  %    FL/AC:      20.5   %    20 - 24  HUM:      50.7  mm     G. Age:  29w 5d         51  %  LV:        1.8  mm  Est. FW:    1565  gm      3 lb 7 oz     68  % ---------------------------------------------------------------------- OB History  Gravidity:    3         Term:   2        Prem:   0        SAB:   0  TOP:          0       Ectopic:  0        Living: 2 ---------------------------------------------------------------------- Gestational Age  LMP:           31w 2d        Date:  08/06/18                 EDD:   05/13/19  U/S Today:     29w 6d                                        EDD:   05/23/19  Best:          29w 4d     Det. ByMarcella Dubs         EDD:   05/25/19                                      (  11/28/18) ---------------------------------------------------------------------- Anatomy  Cranium:                Appears normal         Aortic Arch:            Previously seen  Cavum:                 Appears normal         Ductal Arch:            Previously seen  Ventricles:            Appears normal         Diaphragm:              Appears normal  Choroid Plexus:        Previously seen        Stomach:                Appears normal, left                                                                        sided  Cerebellum:            Previously seen        Abdomen:                Appears normal  Posterior Fossa:       Previously seen        Abdominal Wall:         Previously seen  Nuchal Fold:           Previously seen        Cord Vessels:           Not well visualized  Face:                  Orbits and profile     Kidneys:                Appear normal                         previously seen  Lips:                  Previously seen        Bladder:                Appears normal  Thoracic:              Appears normal         Spine:                  Previously seen  Heart:                 Appears normal         Upper Extremities:      Previously seen                         (4CH, axis, and si  RVOT:                  Previously seen        Lower Extremities:  Previously seen  LVOT:                  Previously seen  Other:  Fetus appears to be female. Heels visualized. Technically difficult due          to maternal habitus and fetal position. ---------------------------------------------------------------------- Impression  Normal interval growth.  Chronic Hypertension  BMI >40 ---------------------------------------------------------------------- Recommendations  Follow up growth in 4-6 weeks. ----------------------------------------------------------------------               Lin Landsmanorenthian Booker, MD Electronically Signed Final Report   03/13/2019 10:17 am ----------------------------------------------------------------------   Assessment and Plan:  Pregnancy: [id number] at 3837w5d 1. Supervision of high risk pregnancy  in third trimester  2. Supervision of high risk pregnancy, antepartum  3. History of gestational hypertension - BP's clinically stable  4. History of cesarean section   5. Maternal morbid obesity, antepartum (HCC)   Preterm labor symptoms and general obstetric precautions including but not limited to vaginal bleeding, contractions, leaking of fluid and fetal movement were reviewed in detail with the patient. I discussed the assessment and treatment plan with the patient. The patient was provided an opportunity to ask questions and all were answered. The patient agreed with the plan and demonstrated an understanding of the instructions. The patient was advised to call back or seek an in-person office evaluation/[facility] for any urgent or concerning symptoms. Please refer to After Visit Summary for other counseling recommendations.   I provided 10 minutes of face-to-face time during this encounter.  Return in about 2 weeks (around 04/18/2019) for MyChart.  Future Appointments  Date Time Provider Department Center  04/17/2019  9:30 AM [facility] NURSE [facility]  04/17/2019  9:30 AM [facility] US 1 [facility]US    Coral Ceoharles Lanore Renderos, MD [facility] 04-04-2019

## 2019-04-17 ENCOUNTER — Other Ambulatory Visit (HOSPITAL_COMMUNITY): Payer: Self-pay | Admitting: Maternal & Fetal Medicine

## 2019-04-17 ENCOUNTER — Other Ambulatory Visit: Payer: Self-pay

## 2019-04-17 ENCOUNTER — Ambulatory Visit (HOSPITAL_COMMUNITY)
Admission: RE | Admit: 2019-04-17 | Discharge: 2019-04-17 | Disposition: A | Discharge status: Home / Self Care | Payer: Medicare Other | Source: Ambulatory Visit | Attending: Obstetrics and Gynecology | Admitting: Obstetrics and Gynecology

## 2019-04-17 ENCOUNTER — Ambulatory Visit (HOSPITAL_COMMUNITY): Payer: Medicare Other | Admitting: *Deleted

## 2019-04-17 ENCOUNTER — Encounter (HOSPITAL_COMMUNITY): Payer: Self-pay | Admitting: *Deleted

## 2019-04-17 VITALS — BP 120/70 | HR 101 | Temp 98.9°F

## 2019-04-17 DIAGNOSIS — Z362 Encounter for other antenatal screening follow-up: Secondary | ICD-10-CM | POA: Diagnosis not present

## 2019-04-17 DIAGNOSIS — O10913 Unspecified pre-existing hypertension complicating pregnancy, third trimester: Secondary | ICD-10-CM | POA: Diagnosis present

## 2019-04-17 DIAGNOSIS — O99213 Obesity complicating pregnancy, third trimester: Secondary | ICD-10-CM | POA: Diagnosis not present

## 2019-04-17 DIAGNOSIS — I10 Essential (primary) hypertension: Secondary | ICD-10-CM | POA: Insufficient documentation

## 2019-04-17 DIAGNOSIS — Z3A34 34 weeks gestation of pregnancy: Secondary | ICD-10-CM

## 2019-04-17 DIAGNOSIS — O34219 Maternal care for unspecified type scar from previous cesarean delivery: Secondary | ICD-10-CM | POA: Diagnosis not present

## 2019-04-17 DIAGNOSIS — O10013 Pre-existing essential hypertension complicating pregnancy, third trimester: Secondary | ICD-10-CM

## 2019-04-18 ENCOUNTER — Telehealth (INDEPENDENT_AMBULATORY_CARE_PROVIDER_SITE_OTHER): Payer: Medicare Other | Admitting: Obstetrics & Gynecology

## 2019-04-18 ENCOUNTER — Encounter: Payer: Self-pay | Admitting: Obstetrics & Gynecology

## 2019-04-18 ENCOUNTER — Other Ambulatory Visit (HOSPITAL_COMMUNITY): Payer: Self-pay | Admitting: *Deleted

## 2019-04-18 VITALS — BP 136/67 | HR 89

## 2019-04-18 DIAGNOSIS — O99213 Obesity complicating pregnancy, third trimester: Secondary | ICD-10-CM

## 2019-04-18 DIAGNOSIS — Z98891 History of uterine scar from previous surgery: Secondary | ICD-10-CM

## 2019-04-18 DIAGNOSIS — O10919 Unspecified pre-existing hypertension complicating pregnancy, unspecified trimester: Secondary | ICD-10-CM

## 2019-04-18 DIAGNOSIS — Z8759 Personal history of other complications of pregnancy, childbirth and the puerperium: Secondary | ICD-10-CM

## 2019-04-18 DIAGNOSIS — Z3A34 34 weeks gestation of pregnancy: Secondary | ICD-10-CM

## 2019-04-18 DIAGNOSIS — O0993 Supervision of high risk pregnancy, unspecified, third trimester: Secondary | ICD-10-CM

## 2019-04-18 NOTE — Progress Notes (Signed)
I connected with Valerie Adkins on 04/18/19 at  9:00 AM EDT by telephone and verified that I am speaking with the correct person using two identifiers.  No concerns today per pt.

## 2019-04-18 NOTE — Progress Notes (Signed)
TELEHEALTH OBSTETRICS PRENATAL VIRTUAL VIDEO VISIT ENCOUNTER NOTE  Provider location: [facility] [employer] [place]   I connected with Valerie Adkins on 04/18/19 at  9:00 AM EDT by MyChart Video Encounter at home and verified that I am speaking with the correct person using two identifiers.   I discussed the limitations, risks, security and privacy concerns of performing an evaluation and management service virtually and the availability of in person appointments. I also discussed with the patient that there may be a patient responsible charge related to this service. The patient expressed understanding and agreed to proceed. Subjective:  Valerie Adkins is a 28 y.o. G3P2002 at [redacted]w[redacted]d being seen today for ongoing prenatal care.  She is currently monitored for the following issues for this high-risk pregnancy and has Supervision of high-risk pregnancy; Maternal morbid obesity, antepartum (HCC); History of cesarean section; and History of gestational hypertension on their problem list.  Patient reports no complaints.  Contractions: Not present. Vag. Bleeding: None.  Movement: Present. Denies any leaking of fluid.   The following portions of the patient's history were reviewed and updated as appropriate: allergies, current medications, past family history, past medical history, past social history, past surgical history and problem list.   Objective:   Vitals:   04/18/19 0857  BP: 136/67  Pulse: 89    Fetal Status:     Movement: Present     General:  Alert, oriented and cooperative. Patient is in no acute distress.  Respiratory: Normal respiratory effort, no problems with respiration noted  Mental Status: Normal mood and affect. Normal behavior. Normal judgment and thought content.  Rest of physical exam deferred due to type of encounter  Imaging: [place] Mfm Fetal Bpp Wo Non Stress  Result Date:  04/17/2019 ----------------------------------------------------------------------  OBSTETRICS REPORT                       (Signed Final 04/17/2019 01:02 pm) ---------------------------------------------------------------------- Patient Info  ID #:       [id number]                          D.O.B.:  12/29/1990 (28 yrs)  Name:       Valerie Crape-                  Visit Date: 04/17/2019 09:34 am              [place] ---------------------------------------------------------------------- Performed By  Performed By:     Earley Brooke     Secondary Phy.:    [facility]                    BS, RDMS  Attending:        Noralee Space MD        Address:           [address]  [address]                                                              [place]                                                              [address]  Referred By:      Adam Phenix         Location[facility]                    MD                                        [facility]  Ref. Address:     [address]                    [place], [place]                    [address] ---------------------------------------------------------------------- Orders   #  Description                          Code         Ordered By   1  [place] MFM OB FOLLOW UP                  09811.91     Lin Landsman   2  [place] MFM FETAL BPP WO NON              76819.01     [facility]      STRESS                                            BOOKER  ----------------------------------------------------------------------   #  Order #                    Accession #                 Episode #   1  [id number]                  [id number]                  [id number]   2  [id number]  [id number]                  [id number]   ---------------------------------------------------------------------- Indications   Maternal morbid obesity (BMI 55)               O99.210 E66.01   [redacted] weeks gestation of pregnancy                Z3A.34   Hypertension - Chronic/Pre-existing (ASA)      O10.019   Previous cesarean delivery, antepartum x 2     O34.219   Encounter for other antenatal screening        Z36.2   follow-up (Low Risk NIPS)  ---------------------------------------------------------------------- Vital Signs                                                 Height:        5'4" ---------------------------------------------------------------------- Fetal Evaluation  Num Of Fetuses:          1  Fetal Heart Rate(bpm):   154  Cardiac Activity:        Observed  Presentation:            Cephalic  Placenta:                Posterior Fundal  P. Cord Insertion:       Previously Visualized  Amniotic Fluid  AFI FV:      Within normal limits  AFI Sum(cm)     %Tile       Largest Pocket(cm)  16.33           59          5.29  RUQ(cm)       RLQ(cm)       LUQ(cm)        LLQ(cm)  3.39          5.29          4.99           2.66 ---------------------------------------------------------------------- Biophysical Evaluation  Amniotic F.V:   Pocket => 2 cm two         F. Tone:         Observed                  planes  F. Movement:    Observed                   Score:           8/8  F. Breathing:   Observed ---------------------------------------------------------------------- Biometry  BPD:      85.4  mm     G. Age:  34w 3d         46  %    CI:        73.78   %    70 - 86                                                          FL/HC:       20.6  %    20.1 - 22.3  HC:      315.8  mm     G.  Age:  35w 3d         37  %    HC/AC:       1.03       0.93 - 1.11  AC:      306.5  mm     G. Age:  34w 4d         56  %    FL/BPD:      76.1  %    71 - 87  FL:         65  mm     G. Age:  33w 4d         17  %    FL/AC:       21.2  %    20 - 24  HUM:      59.3  mm     G. Age:  34w 2d          58  %  Est. FW:    2415   gm     5 lb 5 oz     39  % ---------------------------------------------------------------------- OB History  Gravidity:    3         Term:   2        Prem:   0        SAB:   0  TOP:          0       Ectopic:  0        Living: 2 ---------------------------------------------------------------------- Gestational Age  LMP:           36w 2d        Date:  08/06/18                 EDD:   05/13/19  U/S Today:     34w 4d                                        EDD:   05/25/19  Best:          34w 4d     Det. ByMarcella Dubs         EDD:   05/25/19                                      (11/28/18) ---------------------------------------------------------------------- Anatomy  Cranium:               Appears normal         Aortic Arch:            Previously seen  Cavum:                 Previously seen        Ductal Arch:            Previously seen  Ventricles:            Appears normal         Diaphragm:              Previously seen  Choroid Plexus:        Previously seen        Stomach:                Appears normal, left  sided  Cerebellum:            Previously seen        Abdomen:                Appears normal  Posterior Fossa:       Previously seen        Abdominal Wall:         Previously seen  Nuchal Fold:           Previously seen        Cord Vessels:           Appears normal (3                                                                        vessel cord)  Face:                  Orbits and profile     Kidneys:                Appear normal                         previously seen  Lips:                  Previously seen        Bladder:                Appears normal  Thoracic:              Appears normal         Spine:                  Previously seen  Heart:                 Echogenic focus        Upper Extremities:      Previously seen                         in LV prev seen  RVOT:                  Previously seen         Lower Extremities:      Previously seen  LVOT:                  Previously seen  Other:  Female gender previously seen. Heels previously visualized.          Technically difficult due to maternal habitus and fetal position. ---------------------------------------------------------------------- Cervix Uterus Adnexa  Cervix  Not visualized (advanced GA >24wks) ---------------------------------------------------------------------- Impression  Amniotic fluid is normal and good fetal activity is seen. Fetal  growth is appropriate for gestational age. ---------------------------------------------------------------------- Recommendations  An appointment was made for her to return in 4 weeks for  fetal growth assessment. ----------------------------------------------------------------------                  Noralee Spaceavi Shankar, MD Electronically Signed Final Report   04/17/2019 01:02 pm ----------------------------------------------------------------------  [place] Mfm Ob Follow Up  Result Date: 04/17/2019 ----------------------------------------------------------------------  OBSTETRICS REPORT                       (  Signed Final 04/17/2019 01:02 pm) ---------------------------------------------------------------------- Patient Info  ID #:       [id number]                          D.O.B.:  14-Dec-1990 (28 yrs)  Name:       Valerie JUNKINS-                  Visit Date: 04/17/2019 09:34 am              [place] ---------------------------------------------------------------------- Performed By  Performed By:     Earley Brooke     Secondary Phy.:    [facility]                    BS, RDMS  Attending:        Noralee Space MD        Address:           [address]                                                              [address]                                                              [place]                                                               [address]  Referred By:      Adam Phenix         Location[facility]                    MD                                        [facility]etal Care  Ref. Address:     [address]                    [place], [place]                    [address] ---------------------------------------------------------------------- Orders   #  Description  Code         Ordered By   1  US MFM OB FOLLOW UP                  [id number]     Lin LandsmanCORENTHIAN                                                        [place]   2  US MFM FETAL BPP WO NON              76819.01     [facility]      STRESS                                            BOOKER  ----------------------------------------------------------------------   #  Order #                    Accession #                 Episode #   1  [id number]                  [id number]   2  [id number]                  [id number]                  [id number]  ---------------------------------------------------------------------- Indications   Maternal morbid obesity (BMI 55)               O99.210 E66.01   [redacted] weeks gestation of pregnancy                Z3A.34   Hypertension - Chronic/Pre-existing (ASA)      O10.019   Previous cesarean delivery, antepartum x 2     O34.219   Encounter for other antenatal screening        Z36.2   follow-up (Low Risk NIPS)  ---------------------------------------------------------------------- Vital Signs                                                 Height:        5'4" ---------------------------------------------------------------------- Fetal Evaluation  Num Of Fetuses:          1  Fetal Heart Rate(bpm):   154  Cardiac Activity:        Observed  Presentation:            Cephalic  Placenta:                Posterior Fundal  P. Cord Insertion:       Previously Visualized  Amniotic Fluid  AFI FV:      Within normal limits  AFI Sum(cm)     %Tile       Largest Pocket(cm)  16.33           59           5.29  RUQ(cm)  RLQ(cm)       LUQ(cm)        LLQ(cm)  3.39          5.29          4.99           2.66 ---------------------------------------------------------------------- Biophysical Evaluation  Amniotic F.V:   Pocket => 2 cm two         F. Tone:         Observed                  planes  F. Movement:    Observed                   Score:           8/8  F. Breathing:   Observed ---------------------------------------------------------------------- Biometry  BPD:      85.4  mm     G. Age:  34w 3d         46  %    CI:        73.78   %    70 - 86                                                          FL/HC:       20.6  %    20.1 - 22.3  HC:      315.8  mm     G. Age:  35w 3d         37  %    HC/AC:       1.03       0.93 - 1.11  AC:      306.5  mm     G. Age:  34w 4d         56  %    FL/BPD:      76.1  %    71 - 87  FL:         65  mm     G. Age:  33w 4d         17  %    FL/AC:       21.2  %    20 - 24  HUM:      59.3  mm     G. Age:  34w 2d         58  %  Est. FW:    2415   gm     5 lb 5 oz     39  % ---------------------------------------------------------------------- OB History  Gravidity:    3         Term:   2        Prem:   0        SAB:   0  TOP:          0       Ectopic:  0        Living: 2 ---------------------------------------------------------------------- Gestational Age  LMP:           36w 2d        Date:  08/06/18                 EDD:   05/13/19  U/S Today:  34w 4d                                        EDD:   05/25/19  Best:          34w 4d     Det. ByLoman Chroman         EDD:   05/25/19                                      (11/28/18) ---------------------------------------------------------------------- Anatomy  Cranium:               Appears normal         Aortic Arch:            Previously seen  Cavum:                 Previously seen        Ductal Arch:            Previously seen  Ventricles:            Appears normal         Diaphragm:              Previously seen  Choroid Plexus:         Previously seen        Stomach:                Appears normal, left                                                                        sided  Cerebellum:            Previously seen        Abdomen:                Appears normal  Posterior Fossa:       Previously seen        Abdominal Wall:         Previously seen  Nuchal Fold:           Previously seen        Cord Vessels:           Appears normal (3                                                                        vessel cord)  Face:                  Orbits and profile     Kidneys:                Appear normal  previously seen  Lips:                  Previously seen        Bladder:                Appears normal  Thoracic:              Appears normal         Spine:                  Previously seen  Heart:                 Echogenic focus        Upper Extremities:      Previously seen                         in LV prev seen  RVOT:                  Previously seen        Lower Extremities:      Previously seen  LVOT:                  Previously seen  Other:  Female gender previously seen. Heels previously visualized.          Technically difficult due to maternal habitus and fetal position. ---------------------------------------------------------------------- Cervix Uterus Adnexa  Cervix  Not visualized (advanced GA >24wks) ---------------------------------------------------------------------- Impression  Amniotic fluid is normal and good fetal activity is seen. Fetal  growth is appropriate for gestational age. ---------------------------------------------------------------------- Recommendations  An appointment was made for her to return in 4 weeks for  fetal growth assessment. ----------------------------------------------------------------------                  Noralee Space, MD Electronically Signed Final Report   04/17/2019 01:02 pm ----------------------------------------------------------------------   Assessment and Plan:    Pregnancy: M5H8469 at [redacted]w[redacted]d 1. Supervision of high risk pregnancy in third trimester  2. Maternal morbid obesity, antepartum (HCC) Plan on chart. Pt had [place] yesterday  EFW:    2415   gm     5 lb 5 oz     39  %  3. History of gestational hypertension 136/67 Baby ASA  4. History of cesarean section For a scheduled repeat 05/18/2019  Preterm labor symptoms and general obstetric precautions including but not limited to vaginal bleeding, contractions, leaking of fluid and fetal movement were reviewed in detail with the patient. I discussed the assessment and treatment plan with the patient. The patient was provided an opportunity to ask questions and all were answered. The patient agreed with the plan and demonstrated an understanding of the instructions. The patient was advised to call back or seek an in-person office evaluation/go to [facility] at [facility] for any urgent or concerning symptoms. Please refer to After Visit Summary for other counseling recommendations.   I provided 12 minutes of face-to-face time during this encounter.  Return in about 2 weeks (around 05/02/2019) for in person.  Future Appointments  Date Time Provider Department Center  04/24/2019  7:30 AM WH-MFC NURSE WH-MFC MFC-US  04/24/2019  7:30 AM WH-MFC [place] 1 WH-MFCUS MFC-US  05/01/2019  9:45 AM WH-MFC NURSE WH-MFC MFC-US  05/01/2019  9:45 AM WH-MFC [place] 2 WH-MFCUS MFC-US  05/08/2019 10:45 AM WH-MFC NURSE WH-MFC MFC-US  05/08/2019 10:45 AM WH-MFC [place] 2 WH-MFCUS MFC-US  05/15/2019 10:00 AM WH-MFC NURSE WH-MFC  MFC-US  05/15/2019 10:00 AM WH-MFC [place] 3 WH-MFCUS MFC-US    Willodean Rosenthal, MD [facility], [facility]

## 2019-04-24 ENCOUNTER — Other Ambulatory Visit: Payer: Self-pay

## 2019-04-24 ENCOUNTER — Ambulatory Visit (HOSPITAL_COMMUNITY)
Admission: RE | Admit: 2019-04-24 | Discharge: 2019-04-24 | Disposition: A | Discharge status: Home / Self Care | Payer: Medicare Other | Source: Ambulatory Visit | Attending: Obstetrics and Gynecology | Admitting: Obstetrics and Gynecology

## 2019-04-24 ENCOUNTER — Encounter (HOSPITAL_COMMUNITY): Payer: Self-pay

## 2019-04-24 ENCOUNTER — Ambulatory Visit (HOSPITAL_COMMUNITY): Payer: Medicare Other | Admitting: *Deleted

## 2019-04-24 VITALS — BP 134/83 | HR 98 | Temp 98.7°F

## 2019-04-24 DIAGNOSIS — O0990 Supervision of high risk pregnancy, unspecified, unspecified trimester: Secondary | ICD-10-CM | POA: Insufficient documentation

## 2019-04-24 DIAGNOSIS — O34219 Maternal care for unspecified type scar from previous cesarean delivery: Secondary | ICD-10-CM

## 2019-04-24 DIAGNOSIS — Z3A35 35 weeks gestation of pregnancy: Secondary | ICD-10-CM

## 2019-04-24 DIAGNOSIS — O10013 Pre-existing essential hypertension complicating pregnancy, third trimester: Secondary | ICD-10-CM | POA: Diagnosis not present

## 2019-04-24 DIAGNOSIS — O99213 Obesity complicating pregnancy, third trimester: Secondary | ICD-10-CM

## 2019-04-24 DIAGNOSIS — O10919 Unspecified pre-existing hypertension complicating pregnancy, unspecified trimester: Secondary | ICD-10-CM

## 2019-04-30 ENCOUNTER — Ambulatory Visit (INDEPENDENT_AMBULATORY_CARE_PROVIDER_SITE_OTHER): Payer: Medicare Other | Admitting: Obstetrics and Gynecology

## 2019-04-30 ENCOUNTER — Encounter: Payer: Self-pay | Admitting: Obstetrics and Gynecology

## 2019-04-30 ENCOUNTER — Other Ambulatory Visit: Payer: Self-pay

## 2019-04-30 ENCOUNTER — Other Ambulatory Visit (HOSPITAL_COMMUNITY)
Admission: RE | Admit: 2019-04-30 | Discharge: 2019-04-30 | Disposition: A | Discharge status: Home / Self Care | Payer: Medicare Other | Source: Ambulatory Visit | Attending: Obstetrics and Gynecology | Admitting: Obstetrics and Gynecology

## 2019-04-30 VITALS — BP 148/79 | HR 114 | Wt 331.0 lb

## 2019-04-30 DIAGNOSIS — Z98891 History of uterine scar from previous surgery: Secondary | ICD-10-CM

## 2019-04-30 DIAGNOSIS — O0990 Supervision of high risk pregnancy, unspecified, unspecified trimester: Secondary | ICD-10-CM | POA: Insufficient documentation

## 2019-04-30 DIAGNOSIS — O99213 Obesity complicating pregnancy, third trimester: Secondary | ICD-10-CM

## 2019-04-30 DIAGNOSIS — O0993 Supervision of high risk pregnancy, unspecified, third trimester: Secondary | ICD-10-CM

## 2019-04-30 DIAGNOSIS — Z8759 Personal history of other complications of pregnancy, childbirth and the puerperium: Secondary | ICD-10-CM

## 2019-04-30 DIAGNOSIS — Z3A36 36 weeks gestation of pregnancy: Secondary | ICD-10-CM

## 2019-04-30 NOTE — Progress Notes (Signed)
   PRENATAL VISIT NOTE  Subjective:  Valerie Adkins is a 28 y.o. G3P2002 at [redacted]w[redacted]d being seen today for ongoing prenatal care.  She is currently monitored for the following issues for this high-risk pregnancy and has Supervision of high-risk pregnancy; Maternal morbid obesity, antepartum ([place]); History of cesarean section; and History of gestational hypertension on their problem list.  Patient reports no complaints.  Contractions: Not present. Vag. Bleeding: None.  Movement: Present. Denies leaking of fluid.   The following portions of the patient's history were reviewed and updated as appropriate: allergies, current medications, past family history, past medical history, past social history, past surgical history and problem list.   Objective:   Vitals:   04/30/19 1000  BP: (!) 148/79  Pulse: (!) 114  Weight: (!) 331 lb (150.1 kg)    Fetal Status: Fetal Heart Rate (bpm): 150(Simultaneous filing. User may not have seen previous data.) Fundal Height: 38 cm Movement: Present     General:  Alert, oriented and cooperative. Patient is in no acute distress.  Skin: Skin is warm and dry. No rash noted.   Cardiovascular: Normal heart rate noted  Respiratory: Normal respiratory effort, no problems with respiration noted  Abdomen: Soft, gravid, appropriate for gestational age.  Pain/Pressure: Absent     Pelvic: Cervical exam deferred        Extremities: Normal range of motion.     Mental Status: Normal mood and affect. Normal behavior. Normal judgment and thought content.   Assessment and Plan:  Pregnancy: G3P2002 at [redacted]w[redacted]d 1. Supervision of high risk pregnancy in third trimester Patient is doing well without complaints Cultures today  2. History of cesarean section Scheduled for repeat on 8/8 Plans IUD  3. History of gestational hypertension Elevated BP today without symptoms. Si/Sx pf preeclampsia reviewed Will monitor closely Patient instructed to discontinue ASA at 37  weeks  4. Maternal morbid obesity, antepartum ([place])   Preterm labor symptoms and general obstetric precautions including but not limited to vaginal bleeding, contractions, leaking of fluid and fetal movement were reviewed in detail with the patient. Please refer to After Visit Summary for other counseling recommendations.   Return in about 1 week (around 05/07/2019) for [facility] [place].  Future Appointments  Date Time Provider [place]  04/30/2019 10:30 AM Taye Cato, Vickii Chafe, MD [place] None  05/01/2019  9:45 AM WH-MFC NURSE [place] MFC-US  05/01/2019  9:45 AM WH-MFC [place] 2 WH-MFCUS MFC-US  05/07/2019  9:45 AM Shelly Bombard, MD [facility] None  05/08/2019 10:45 AM WH-MFC NURSE WH-MFC MFC-US  05/08/2019 10:45 AM WH-MFC [place] 2 WH-MFCUS MFC-US  05/15/2019 10:00 AM WH-MFC NURSE WH-MFC MFC-US  05/15/2019 10:00 AM WH-MFC [place] 3 WH-MFCUS MFC-US    Mora Bellman, MD

## 2019-04-30 NOTE — Addendum Note (Signed)
Addended by: Mora Bellman on: 04/30/2019 10:21 AM   Modules accepted: Orders

## 2019-05-01 ENCOUNTER — Ambulatory Visit (HOSPITAL_COMMUNITY): Payer: Medicare Other | Admitting: *Deleted

## 2019-05-01 ENCOUNTER — Encounter (HOSPITAL_COMMUNITY): Payer: Self-pay

## 2019-05-01 ENCOUNTER — Ambulatory Visit (HOSPITAL_COMMUNITY)
Admission: RE | Admit: 2019-05-01 | Discharge: 2019-05-01 | Disposition: A | Discharge status: Home / Self Care | Payer: Medicare Other | Source: Ambulatory Visit | Attending: Obstetrics and Gynecology | Admitting: Obstetrics and Gynecology

## 2019-05-01 VITALS — BP 128/81 | HR 97 | Temp 98.5°F

## 2019-05-01 DIAGNOSIS — O0990 Supervision of high risk pregnancy, unspecified, unspecified trimester: Secondary | ICD-10-CM | POA: Insufficient documentation

## 2019-05-01 DIAGNOSIS — O10919 Unspecified pre-existing hypertension complicating pregnancy, unspecified trimester: Secondary | ICD-10-CM | POA: Diagnosis present

## 2019-05-01 DIAGNOSIS — O10913 Unspecified pre-existing hypertension complicating pregnancy, third trimester: Secondary | ICD-10-CM | POA: Diagnosis not present

## 2019-05-01 DIAGNOSIS — O10013 Pre-existing essential hypertension complicating pregnancy, third trimester: Secondary | ICD-10-CM

## 2019-05-01 DIAGNOSIS — Z3A36 36 weeks gestation of pregnancy: Secondary | ICD-10-CM | POA: Diagnosis not present

## 2019-05-01 DIAGNOSIS — O34219 Maternal care for unspecified type scar from previous cesarean delivery: Secondary | ICD-10-CM | POA: Diagnosis not present

## 2019-05-01 LAB — GC/CHLAMYDIA PROBE AMP (~~LOC~~) NOT AT ARMC
Chlamydia: NEGATIVE
Neisseria Gonorrhea: NEGATIVE

## 2019-05-04 LAB — CULTURE, BETA STREP (GROUP B ONLY): Strep Gp B Culture: NEGATIVE

## 2019-05-06 ENCOUNTER — Encounter (HOSPITAL_COMMUNITY): Payer: Self-pay

## 2019-05-06 NOTE — Patient Instructions (Signed)
[place]  05/06/2019   Your procedure is scheduled on:  05/18/2019  Arrive at 0700 at Entrance C on [address] at [facility]  and [employer]. You are invited to use the FREE valet parking or use the Visitor's parking deck.  Pick up the phone at the desk and dial [phone].  Call this number if you have problems the morning of surgery: [phone]  Remember:   Do not eat food:(After Midnight) Desps de medianoche.  Do not drink clear liquids: (After Midnight) Desps de medianoche.  Take these medicines the morning of surgery with A SIP OF WATER:  none   Do not wear jewelry, make-up or nail polish.  Do not wear lotions, powders, or perfumes. Do not wear deodorant.  Do not shave 48 hours prior to surgery.  Do not bring valuables to the hospital.  [facility] is not   responsible for any belongings or valuables brought to the hospital.  Contacts, dentures or bridgework may not be worn into surgery.  Leave suitcase in the car. After surgery it may be brought to your room.  For patients admitted to the hospital, checkout time is 11:00 AM the day of              discharge.      Please read over the following fact sheets that you were given:     Preparing for Surgery

## 2019-05-07 ENCOUNTER — Encounter: Payer: Self-pay | Admitting: Obstetrics

## 2019-05-07 ENCOUNTER — Telehealth (INDEPENDENT_AMBULATORY_CARE_PROVIDER_SITE_OTHER): Payer: Medicare Other | Admitting: Obstetrics

## 2019-05-07 VITALS — BP 126/80 | HR 84

## 2019-05-07 DIAGNOSIS — O99213 Obesity complicating pregnancy, third trimester: Secondary | ICD-10-CM

## 2019-05-07 DIAGNOSIS — O0993 Supervision of high risk pregnancy, unspecified, third trimester: Secondary | ICD-10-CM

## 2019-05-07 DIAGNOSIS — Z98891 History of uterine scar from previous surgery: Secondary | ICD-10-CM

## 2019-05-07 DIAGNOSIS — Z3A37 37 weeks gestation of pregnancy: Secondary | ICD-10-CM

## 2019-05-07 DIAGNOSIS — Z8759 Personal history of other complications of pregnancy, childbirth and the puerperium: Secondary | ICD-10-CM

## 2019-05-07 NOTE — Progress Notes (Signed)
Pt is on the phone preparing for virtual visit with provider. [redacted]w[redacted]d. Repeat C section scheduled for 05/18/2019.

## 2019-05-07 NOTE — Progress Notes (Signed)
TELEHEALTH OBSTETRICS PRENATAL VIRTUAL VIDEO VISIT ENCOUNTER NOTE  Provider location: [facility] [place]   I connected with Kresta [place] on 05/07/19 at  9:45 AM EDT by WebEx OB MyChart Video Encounter at home and verified that I am speaking with the correct person using two identifiers.   I discussed the limitations, risks, security and privacy concerns of performing an evaluation and management service virtually and the availability of in person appointments. I also discussed with the patient that there may be a patient responsible charge related to this service. The patient expressed understanding and agreed to proceed. Subjective:  Nariyah Osias is a 28 y.o. G3P2002 at [redacted]w[redacted]d being seen today for ongoing prenatal care.  She is currently monitored for the following issues for this high-risk pregnancy and has Supervision of high-risk pregnancy; Maternal morbid obesity, antepartum (HCC); History of cesarean section; and History of gestational hypertension on their problem list.  Patient reports no complaints.  Contractions: Not present. Vag. Bleeding: None.  Movement: Present. Denies any leaking of fluid.   The following portions of the patient's history were reviewed and updated as appropriate: allergies, current medications, past family history, past medical history, past social history, past surgical history and problem list.   Objective:   Vitals:   05/07/19 1005  BP: 126/80  Pulse: 84    Fetal Status:     Movement: Present     General:  Alert, oriented and cooperative. Patient is in no acute distress.  Respiratory: Normal respiratory effort, no problems with respiration noted  Mental Status: Normal mood and affect. Normal behavior. Normal judgment and thought content.  Rest of physical exam deferred due to type of encounter  Imaging: [place] Mfm Fetal Bpp Wo Non Stress  Result Date:  05/01/2019 ----------------------------------------------------------------------  OBSTETRICS REPORT                       (Signed Final 05/01/2019 01:09 pm) ---------------------------------------------------------------------- Patient Info  ID #:       [id number]                          D.O.B.:  [birth year] (28 yrs)  Name:       KYANA AICHER-                  Visit Date: 05/01/2019 09:25 am              [place] ---------------------------------------------------------------------- Performed By  Performed By:     Percell Boston          Secondary Phy.:    [facility]                    RDMS  Attending:        Noralee Space MD        Address:           [address]  [address]                                                              [place]                                                              [id number]  Referred By:      Adam PhenixJAMES G ARNOLD         Location[facility]                    MD                                        [facility]  Ref. Address:     [address]                    [place], [place]                    [id number] ---------------------------------------------------------------------- Orders   #  Description                          Code         Ordered By   1  US MFM FETAL BPP WO NON              [id number]     [facility]                                            BOOKER  ----------------------------------------------------------------------   #  Order #                    Accession #                 Episode #   1  [id number]                  [id number]                  [id number]  ---------------------------------------------------------------------- Indications   [redacted] weeks gestation of pregnancy                Z3A.36   Maternal morbid obesity (BMI 55)               O99.210 E66.01   Hypertension -  Chronic/Pre-existing (ASA)      O10.019   Previous cesarean delivery, antepartum x 2     O34.219  ---------------------------------------------------------------------- Vital Signs  Height:        5'4" ---------------------------------------------------------------------- Fetal Evaluation  Num Of Fetuses:          1  Fetal Heart Rate(bpm):   134  Cardiac Activity:        Observed  Presentation:            Breech  Amniotic Fluid  AFI FV:      Within normal limits  AFI Sum(cm)     %Tile       Largest Pocket(cm)  12.31           40          4.03  RUQ(cm)       RLQ(cm)       LUQ(cm)        LLQ(cm)  2.92          4.03          2.88           2.48 ---------------------------------------------------------------------- Biophysical Evaluation  Amniotic F.V:   Within normal limits       F. Tone:         Observed  F. Movement:    Observed                   Score:           8/8  F. Breathing:   Observed ---------------------------------------------------------------------- OB History  Gravidity:    3         Term:   2        Prem:   0        SAB:   0  TOP:          0       Ectopic:  0        Living: 2 ---------------------------------------------------------------------- Gestational Age  LMP:           38w 2d        Date:  08/06/18                 EDD:   05/13/19  Best:          36w 4d     Det. ByMarcella Dubs:  Early Ultrasound         EDD:   05/25/19                                      (11/28/18) ---------------------------------------------------------------------- Anatomy  Thoracic:              Appears normal         Bladder:                Appears normal  Stomach:               Appears normal, left                         sided ---------------------------------------------------------------------- Cervix Uterus Adnexa  Cervix  Not visualized (advanced GA >24wks) ---------------------------------------------------------------------- Impression  Amniotic fluid is normal and good fetal  activity is seen.  Antenatal testing is reassuring. BPP 8/8. ---------------------------------------------------------------------- Recommendations  -Continue weekly BPP till delivery. ----------------------------------------------------------------------                  Noralee Spaceavi Shankar, MD Electronically Signed Final Report   05/01/2019 01:09 pm ----------------------------------------------------------------------  [place] Mfm Fetal Bpp Wo Non Stress  Result Date: 04/24/2019 ----------------------------------------------------------------------  OBSTETRICS REPORT                       (Signed Final 04/24/2019 04:47 pm) ---------------------------------------------------------------------- Patient Info  ID #:       [id number]                          D.O.B.:  1991-02-04 (28 yrs)  Name:       MANJOT BEUMER-                  Visit Date: 04/24/2019 07:55 am              [place] ---------------------------------------------------------------------- Performed By  Performed By:     Eden Lathe BS      Secondary Phy.:   [facility]                    RDMS RVT  Attending:        Lin Landsman      Address:          [address]                    [place]                                                             [address]                                                             [address]                                                             [place]                                                             [address]  Referred By:      Adam Phenix         Location:         [facility]                    MD                                       [facility]  Ref. Address:     [address]  [place]                    [id number] ---------------------------------------------------------------------- Orders   #  Description                          Code         Ordered By   1  US MFM FETAL BPP WO NON              76819.01     [facility]      STRESS                                             BOOKER  ----------------------------------------------------------------------   #  Order #                    Accession #                 Episode #   1  [id number]                  [id number]                  [id number]  ---------------------------------------------------------------------- Indications   Encounter for other antenatal screening        Z36.2   follow-up (Low Risk NIPS)   Maternal morbid obesity (BMI 55)               O99.210 E66.01   Hypertension - Chronic/Pre-existing (ASA)      O10.019   Previous cesarean delivery, antepartum x 2     O34.219   [redacted] weeks gestation of pregnancy                Z3A.35  ---------------------------------------------------------------------- Vital Signs                                                 Height:        5'4" ---------------------------------------------------------------------- Fetal Evaluation  Num Of Fetuses:         1  Fetal Heart Rate(bpm):  141  Cardiac Activity:       Observed  Presentation:           Cephalic  Amniotic Fluid  AFI FV:      Within normal limits  AFI Sum(cm)     %Tile       Largest Pocket(cm)  8.76            12          5.66  RUQ(cm)       RLQ(cm)       LUQ(cm)        LLQ(cm)  5.66          3.1           0              0 ---------------------------------------------------------------------- Biophysical Evaluation  Amniotic F.V:   Within normal limits       F. Tone:        Observed  F. Movement:    Observed  Score:          8/8  F. Breathing:   Observed ---------------------------------------------------------------------- OB History  Gravidity:    3         Term:   2        Prem:   0        SAB:   0  TOP:          0       Ectopic:  0        Living: 2 ---------------------------------------------------------------------- Gestational Age  LMP:           37w 2d        Date:  08/06/18                 EDD:   05/13/19  Best:          35w 4d     Det. ByMarcella Dubs         EDD:   05/25/19                                       (11/28/18) ---------------------------------------------------------------------- Impression  Biophysical profile 8/8 ---------------------------------------------------------------------- Recommendations  Repeat BPP in 1 weeks  Delivery scheduled 8/8 ----------------------------------------------------------------------               Lin Landsman, MD Electronically Signed Final Report   04/24/2019 04:47 pm ----------------------------------------------------------------------  [place] Mfm Fetal Bpp Wo Non Stress  Result Date: 04/17/2019 ----------------------------------------------------------------------  OBSTETRICS REPORT                       (Signed Final 04/17/2019 01:02 pm) ---------------------------------------------------------------------- Patient Info  ID #:       [id number]                          D.O.B.:  12/22/1990 (28 yrs)  Name:       Debarah Crape-                  Visit Date: 04/17/2019 09:34 am              [place] ---------------------------------------------------------------------- Performed By  Performed By:     Earley Brooke     Secondary Phy.:    [facility]                    BS, RDMS  Attending:        Noralee Space MD        Address:           [address]                                                              [phone]  [place]                                                              [address]  Referred By:      Adam Phenix         Location[facility]                    MD                                        [facility]  Ref. Address:     [address]                    [place], [place]                    [address] ---------------------------------------------------------------------- Orders   #  Description                          Code         Ordered By   1  [place] MFM OB  FOLLOW UP                  46962.95     Lin Landsman   2  [place] MFM FETAL BPP WO NON              76819.01     [facility]      STRESS                                            BOOKER  ----------------------------------------------------------------------   #  Order #                    Accession #                 Episode #   1  [id number]                  [id number]                  [id number]   2  [id number]                  [id number]                  [id number]  ---------------------------------------------------------------------- Indications   Maternal morbid obesity (BMI 55)               O99.210 E66.01   [redacted] weeks gestation of  pregnancy                Z3A.34   Hypertension - Chronic/Pre-existing (ASA)      O10.019   Previous cesarean delivery, antepartum x 2     O34.219   Encounter for other antenatal screening        Z36.2   follow-up (Low Risk NIPS)  ---------------------------------------------------------------------- Vital Signs                                                 Height:        5'4" ---------------------------------------------------------------------- Fetal Evaluation  Num Of Fetuses:          1  Fetal Heart Rate(bpm):   154  Cardiac Activity:        Observed  Presentation:            Cephalic  Placenta:                Posterior Fundal  P. Cord Insertion:       Previously Visualized  Amniotic Fluid  AFI FV:      Within normal limits  AFI Sum(cm)     %Tile       Largest Pocket(cm)  16.33           59          5.29  RUQ(cm)       RLQ(cm)       LUQ(cm)        LLQ(cm)  3.39          5.29          4.99           2.66 ---------------------------------------------------------------------- Biophysical Evaluation  Amniotic F.V:   Pocket => 2 cm two         F. Tone:         Observed                  planes  F. Movement:    Observed                   Score:           8/8  F. Breathing:   Observed  ---------------------------------------------------------------------- Biometry  BPD:      85.4  mm     G. Age:  34w 3d         46  %    CI:        73.78   %    70 - 86                                                          FL/HC:       20.6  %    20.1 - 22.3  HC:      315.8  mm     G. Age:  35w 3d         37  %    HC/AC:       1.03       0.93 - 1.11  AC:      306.5  mm     G. Age:  34w 4d         56  %    FL/BPD:      76.1  %    71 - 87  FL:         65  mm     G. Age:  33w 4d         17  %    FL/AC:       21.2  %    20 - 24  HUM:      59.3  mm     G. Age:  34w 2d         58  %  Est. FW:    2415   gm     5 lb 5 oz     39  % ---------------------------------------------------------------------- OB History  Gravidity:    3         Term:   2        Prem:   0        SAB:   0  TOP:          0       Ectopic:  0        Living: 2 ---------------------------------------------------------------------- Gestational Age  LMP:           36w 2d        Date:  08/06/18                 EDD:   05/13/19  U/S Today:     34w 4d                                        EDD:   05/25/19  Best:          34w 4d     Det. ByMarcella Dubs         EDD:   05/25/19                                      (11/28/18) ---------------------------------------------------------------------- Anatomy  Cranium:               Appears normal         Aortic Arch:            Previously seen  Cavum:                 Previously seen        Ductal Arch:            Previously seen  Ventricles:            Appears normal         Diaphragm:              Previously seen  Choroid Plexus:        Previously seen        Stomach:                Appears normal, left  sided  Cerebellum:            Previously seen        Abdomen:                Appears normal  Posterior Fossa:       Previously seen        Abdominal Wall:         Previously seen  Nuchal Fold:           Previously seen        Cord Vessels:            Appears normal (3                                                                        vessel cord)  Face:                  Orbits and profile     Kidneys:                Appear normal                         previously seen  Lips:                  Previously seen        Bladder:                Appears normal  Thoracic:              Appears normal         Spine:                  Previously seen  Heart:                 Echogenic focus        Upper Extremities:      Previously seen                         in LV prev seen  RVOT:                  Previously seen        Lower Extremities:      Previously seen  LVOT:                  Previously seen  Other:  Female gender previously seen. Heels previously visualized.          Technically difficult due to maternal habitus and fetal position. ---------------------------------------------------------------------- Cervix Uterus Adnexa  Cervix  Not visualized (advanced GA >24wks) ---------------------------------------------------------------------- Impression  Amniotic fluid is normal and good fetal activity is seen. Fetal  growth is appropriate for gestational age. ---------------------------------------------------------------------- Recommendations  An appointment was made for her to return in 4 weeks for  fetal growth assessment. ----------------------------------------------------------------------                  Noralee Space, MD Electronically Signed Final Report   04/17/2019 01:02 pm ----------------------------------------------------------------------  [place] Mfm Ob Follow Up  Result Date: 04/17/2019 ----------------------------------------------------------------------  OBSTETRICS REPORT                       (  Signed Final 04/17/2019 01:02 pm) ---------------------------------------------------------------------- Patient Info  ID #:       [id number]                          D.O.B.:  02-20-1991 (28 yrs)  Name:       MARIAME RYBOLT-                  Visit Date:  04/17/2019 09:34 am              [place] ---------------------------------------------------------------------- Performed By  Performed By:     Dorena Dew     Secondary Phy.:    [place] [place]  Attending:        Tama High MD        Address:           [place]                                                              [place]  Referred By:      Woodroe Mode         [facility]                    MD                                        [facility]  Ref. Address:     [place] ---------------------------------------------------------------------- Orders   #  Description  Code         Ordered By   1  [place] MFM OB FOLLOW UP                  [id number]     Lin Landsman   2  [place] MFM FETAL BPP WO NON              76819.01     [facility]      STRESS                                            BOOKER  ----------------------------------------------------------------------   #  Order #                    Accession #                 Episode #   1  [id number]                  [id number]                  [id number]   2  [id number]                  [id number]                  [id number]  ---------------------------------------------------------------------- Indications   Maternal morbid obesity (BMI 55)               O99.210 E66.01   [redacted] weeks gestation of pregnancy                Z3A.34   Hypertension - Chronic/Pre-existing (ASA)      O10.019   Previous cesarean delivery, antepartum x 2     O34.219   Encounter for other antenatal screening        Z36.2   follow-up (Low Risk NIPS)   ---------------------------------------------------------------------- Vital Signs                                                 Height:        5'4" ---------------------------------------------------------------------- Fetal Evaluation  Num Of Fetuses:          1  Fetal Heart Rate(bpm):   154  Cardiac Activity:        Observed  Presentation:            Cephalic  Placenta:                Posterior Fundal  P. Cord Insertion:       Previously Visualized  Amniotic Fluid  AFI FV:      Within normal limits  AFI Sum(cm)     %Tile       Largest Pocket(cm)  16.33           59          5.29  RUQ(cm)  RLQ(cm)       LUQ(cm)        LLQ(cm)  3.39          5.29          4.99           2.66 ---------------------------------------------------------------------- Biophysical Evaluation  Amniotic F.V:   Pocket => 2 cm two         F. Tone:         Observed                  planes  F. Movement:    Observed                   Score:           8/8  F. Breathing:   Observed ---------------------------------------------------------------------- Biometry  BPD:      85.4  mm     G. Age:  34w 3d         46  %    CI:        73.78   %    70 - 86                                                          FL/HC:       20.6  %    20.1 - 22.3  HC:      315.8  mm     G. Age:  35w 3d         37  %    HC/AC:       1.03       0.93 - 1.11  AC:      306.5  mm     G. Age:  34w 4d         56  %    FL/BPD:      76.1  %    71 - 87  FL:         65  mm     G. Age:  33w 4d         17  %    FL/AC:       21.2  %    20 - 24  HUM:      59.3  mm     G. Age:  34w 2d         58  %  Est. FW:    2415   gm     5 lb 5 oz     39  % ---------------------------------------------------------------------- OB History  Gravidity:    3         Term:   2        Prem:   0        SAB:   0  TOP:          0       Ectopic:  0        Living: 2 ---------------------------------------------------------------------- Gestational Age  LMP:           36w 2d        Date:  08/06/18                  EDD:   05/13/19  U/S Today:  34w 4d                                        EDD:   05/25/19  Best:          34w 4d     Det. ByMarcella Dubs         EDD:   05/25/19                                      (11/28/18) ---------------------------------------------------------------------- Anatomy  Cranium:               Appears normal         Aortic Arch:            Previously seen  Cavum:                 Previously seen        Ductal Arch:            Previously seen  Ventricles:            Appears normal         Diaphragm:              Previously seen  Choroid Plexus:        Previously seen        Stomach:                Appears normal, left                                                                        sided  Cerebellum:            Previously seen        Abdomen:                Appears normal  Posterior Fossa:       Previously seen        Abdominal Wall:         Previously seen  Nuchal Fold:           Previously seen        Cord Vessels:           Appears normal (3                                                                        vessel cord)  Face:                  Orbits and profile     Kidneys:                Appear normal                         previously  seen  Lips:                  Previously seen        Bladder:                Appears normal  Thoracic:              Appears normal         Spine:                  Previously seen  Heart:                 Echogenic focus        Upper Extremities:      Previously seen                         in LV prev seen  RVOT:                  Previously seen        Lower Extremities:      Previously seen  LVOT:                  Previously seen  Other:  Female gender previously seen. Heels previously visualized.          Technically difficult due to maternal habitus and fetal position. ---------------------------------------------------------------------- Cervix Uterus Adnexa  Cervix  Not visualized (advanced GA >24wks)  ---------------------------------------------------------------------- Impression  Amniotic fluid is normal and good fetal activity is seen. Fetal  growth is appropriate for gestational age. ---------------------------------------------------------------------- Recommendations  An appointment was made for her to return in 4 weeks for  fetal growth assessment. ----------------------------------------------------------------------                  Noralee Space, MD Electronically Signed Final Report   04/17/2019 01:02 pm ----------------------------------------------------------------------   Assessment and Plan:  Pregnancy: [id number] at [redacted]w[redacted]d 1. Supervision of high risk pregnancy in third trimester  2. History of cesarean section x 2 - repeat C/S and IUD placement scheduled at 39 weeks  3. History of gestational hypertension - taking Baby ASA  4. Maternal morbid obesity, antepartum (HCC)   Term labor symptoms and general obstetric precautions including but not limited to vaginal bleeding, contractions, leaking of fluid and fetal movement were reviewed in detail with the patient. I discussed the assessment and treatment plan with the patient. The patient was provided an opportunity to ask questions and all were answered. The patient agreed with the plan and demonstrated an understanding of the instructions. The patient was advised to call back or seek an in-person office evaluation/go to [facility] at [facility] for any urgent or concerning symptoms. Please refer to After Visit Summary for other counseling recommendations.   I provided 10 minutes of face-to-face time during this encounter.  Return in about 1 week (around 05/14/2019) for WebEx.  Future Appointments  Date Time Provider Department Center  05/08/2019 10:45 AM [facility] NURSE [facility]  05/08/2019 10:45 AM [facility] [place] 2 WH-MFCUS M[facility]-US  05/15/2019 10:00 AM [facility] NURSE [facility]  05/15/2019 10:00 AM [facility] [place] 3 WH-MFCUS  M[facility]-US  05/16/2019  7:30 AM [facility]U 1 MC-INDC None    Coral Ceo, MD [facility], [facility] 05-07-2019

## 2019-05-08 ENCOUNTER — Encounter (HOSPITAL_COMMUNITY): Payer: Self-pay

## 2019-05-08 ENCOUNTER — Ambulatory Visit (HOSPITAL_COMMUNITY): Payer: Medicare Other | Admitting: *Deleted

## 2019-05-08 ENCOUNTER — Ambulatory Visit (HOSPITAL_COMMUNITY)
Admission: RE | Admit: 2019-05-08 | Discharge: 2019-05-08 | Disposition: A | Discharge status: Home / Self Care | Payer: Medicare Other | Source: Ambulatory Visit | Attending: Obstetrics and Gynecology | Admitting: Obstetrics and Gynecology

## 2019-05-08 ENCOUNTER — Other Ambulatory Visit: Payer: Self-pay

## 2019-05-08 VITALS — BP 131/99 | HR 102 | Temp 98.7°F

## 2019-05-08 DIAGNOSIS — O10019 Pre-existing essential hypertension complicating pregnancy, unspecified trimester: Secondary | ICD-10-CM | POA: Diagnosis not present

## 2019-05-08 DIAGNOSIS — Z3A37 37 weeks gestation of pregnancy: Secondary | ICD-10-CM

## 2019-05-08 DIAGNOSIS — O10919 Unspecified pre-existing hypertension complicating pregnancy, unspecified trimester: Secondary | ICD-10-CM | POA: Diagnosis not present

## 2019-05-08 DIAGNOSIS — O99210 Obesity complicating pregnancy, unspecified trimester: Secondary | ICD-10-CM

## 2019-05-08 DIAGNOSIS — O34219 Maternal care for unspecified type scar from previous cesarean delivery: Secondary | ICD-10-CM

## 2019-05-08 DIAGNOSIS — O0990 Supervision of high risk pregnancy, unspecified, unspecified trimester: Secondary | ICD-10-CM | POA: Insufficient documentation

## 2019-05-14 ENCOUNTER — Telehealth: Payer: Self-pay

## 2019-05-14 ENCOUNTER — Telehealth (INDEPENDENT_AMBULATORY_CARE_PROVIDER_SITE_OTHER): Payer: Medicare Other | Admitting: Obstetrics

## 2019-05-14 ENCOUNTER — Other Ambulatory Visit: Payer: Self-pay

## 2019-05-14 ENCOUNTER — Encounter: Payer: Self-pay | Admitting: Obstetrics

## 2019-05-14 DIAGNOSIS — O0993 Supervision of high risk pregnancy, unspecified, third trimester: Secondary | ICD-10-CM

## 2019-05-14 DIAGNOSIS — Z3A38 38 weeks gestation of pregnancy: Secondary | ICD-10-CM

## 2019-05-14 DIAGNOSIS — Z8759 Personal history of other complications of pregnancy, childbirth and the puerperium: Secondary | ICD-10-CM

## 2019-05-14 DIAGNOSIS — Z98891 History of uterine scar from previous surgery: Secondary | ICD-10-CM

## 2019-05-14 DIAGNOSIS — O99210 Obesity complicating pregnancy, unspecified trimester: Secondary | ICD-10-CM

## 2019-05-14 DIAGNOSIS — O99213 Obesity complicating pregnancy, third trimester: Secondary | ICD-10-CM

## 2019-05-14 NOTE — Telephone Encounter (Signed)
Pt called and reports that she had some mucousy discharge in her underwear earlier today and wanted to know if this was normal. I advised patient that this can be normal at this point in pregnancy and that the discharge is most likely part of her mucous plug. Pt denies contractions, denies bleeding, and reports good fetal movement. Pt verbalizes understanding.

## 2019-05-14 NOTE — Progress Notes (Signed)
CC: None  

## 2019-05-14 NOTE — Progress Notes (Addendum)
TELEHEALTH OBSTETRICS PRENATAL VIRTUAL VIDEO VISIT ENCOUNTER NOTE  Provider location: [facility] [place]   I connected with Raiza [place] on 05/14/19 at  9:45 AM EDT by WebEx OB MyChart Video Encounter at home and verified that I am speaking with the correct person using two identifiers.   I discussed the limitations, risks, security and privacy concerns of performing an evaluation and management service virtually and the availability of in person appointments. I also discussed with the patient that there may be a patient responsible charge related to this service. The patient expressed understanding and agreed to proceed. Subjective:  Allan Bacigalupi is a 28 y.o. G3P2002 at [redacted]w[redacted]d being seen today for ongoing prenatal care.  She is currently monitored for the following issues for this high-risk pregnancy and has Supervision of high-risk pregnancy; Maternal morbid obesity, antepartum (HCC); History of cesarean section; and History of gestational hypertension on their problem list.  Patient reports no complaints.  Contractions: Not present. Vag. Bleeding: None.  Movement: Present. Denies any leaking of fluid.   The following portions of the patient's history were reviewed and updated as appropriate: allergies, current medications, past family history, past medical history, past social history, past surgical history and problem list.   Objective:  There were no vitals filed for this visit.  Fetal Status:     Movement: Present     General:  Alert, oriented and cooperative. Patient is in no acute distress.  Respiratory: Normal respiratory effort, no problems with respiration noted  Mental Status: Normal mood and affect. Normal behavior. Normal judgment and thought content.  Rest of physical exam deferred due to type of encounter  Imaging: [place] Mfm Fetal Bpp Wo Non Stress  Result Date:  05/09/2019 ----------------------------------------------------------------------  OBSTETRICS REPORT                        (Signed Final 05/09/2019 11:37 am) ---------------------------------------------------------------------- Patient Info  ID #:       [id number]                          D.O.B.:  18-Feb-1991 (28 yrs)  Name:       Valerie Adkins-                  Visit Date: 05/08/2019 11:22 am              [place] ---------------------------------------------------------------------- Performed By  Performed By:     Ellin Saba        Secondary Phy.:    [facility]                    [id number]  Attending:        Lin Landsman      Address:           [address]                    [address]                                                              Road  [place]  Referred By:      Woodroe Mode         Location[facility]                    MD                                        [facility]  Ref. Address:     [place], [place] ---------------------------------------------------------------------- Orders   #  Description                          Code         Ordered By   1  [place] MFM FETAL BPP WO NON              95284.13     [place]  ----------------------------------------------------------------------   #  Order #                    Accession #                 Episode #   1  [id number]                  [id number]                  [id number]  ---------------------------------------------------------------------- Indications   Maternal morbid obesity (BMI 55)               O99.210 E66.01   [redacted] weeks gestation of pregnancy                Z3A.37    Hypertension - Chronic/Pre-existing (ASA)      O10.019   Previous cesarean delivery, antepartum x 2     O34.219   Encounter for other antenatal screening        Z36.2   follow-up (Low Risk NIPS)  ---------------------------------------------------------------------- Vital  Signs                                                 Height:        5'4" ---------------------------------------------------------------------- Fetal Evaluation  Num Of Fetuses:          1  Fetal Heart Rate(bpm):   162  Cardiac Activity:        Observed  Presentation:            Cephalic  Placenta:                Posterior  Amniotic Fluid  AFI FV:      Within normal limits  AFI Sum(cm)     %Tile       Largest Pocket(cm)  12.84           46          6.61  RUQ(cm)                     LUQ(cm)        LLQ(cm)  3.42                        2.81           6.61 ---------------------------------------------------------------------- Biophysical Evaluation  Amniotic F.V:   Within normal limits       F. Tone:         Observed  F. Movement:    Observed                   Score:           8/8  F. Breathing:   Observed ---------------------------------------------------------------------- OB History  Gravidity:    3         Term:   2        Prem:   0        SAB:   0  TOP:          0       Ectopic:  0        Living: 2 ---------------------------------------------------------------------- Gestational Age  LMP:           39w 2d        Date:  08/06/18                 EDD:   05/13/19  Best:          37w 4d     Det. By:  Marcella DubsEarly Ultrasound         EDD:   05/25/19                                      (11/28/18) ---------------------------------------------------------------------- Anatomy  Stomach:               Appears normal, left   Bladder:                Appears normal                         sided  Kidneys:               Appear normal ---------------------------------------------------------------------- Impression  Biophysical profile 8/8  ----------------------------------------------------------------------  Recommendations  Continue weekly BPP  Scheduled delivery on 8/8 ----------------------------------------------------------------------               Lin Landsman, MD Electronically Signed Final Report   05/09/2019 11:37 am ----------------------------------------------------------------------  [place] Mfm Fetal Bpp Wo Non Stress  Result Date: 05/01/2019 ----------------------------------------------------------------------  OBSTETRICS REPORT                       (Signed Final 05/01/2019 01:09 pm) ---------------------------------------------------------------------- Patient Info  ID #:       [id number]                          D.O.B.:  1991/08/10 (28 yrs)  Name:       ESTERA OZIER-                  Visit Date: 05/01/2019 09:25 am              [place] ---------------------------------------------------------------------- Performed By  Performed By:     Percell Boston          Secondary Phy.:    [facility]                    RDMS  Attending:        Noralee Space MD        Address:           [address]                                                              [address]                                                              [place]                                                              [address]  Referred By:      Adam Phenix         Location:          [facility]                    MD                                        [facility]etal [facility]  Ref. Address:  [address]                    [place], [place]                    [address] ---------------------------------------------------------------------- Orders   #  Description                          Code         Ordered By   1  [place] MFM FETAL BPP WO NON              76819.01     [place]AN      STRESS                                            BOOKER   ----------------------------------------------------------------------   #  Order #                    Accession #                 Episode #   1  [id number]                  [id number]                  [id number]  ---------------------------------------------------------------------- Indications   [redacted] weeks gestation of pregnancy                Z3A.36   Maternal morbid obesity (BMI 55)               O99.210 E66.01   Hypertension - Chronic/Pre-existing (ASA)      O10.019   Previous cesarean delivery, antepartum x 2     O34.219  ---------------------------------------------------------------------- Vital Signs                                                 Height:        5'4" ---------------------------------------------------------------------- Fetal Evaluation  Num Of Fetuses:          1  Fetal Heart Rate(bpm):   134  Cardiac Activity:        Observed  Presentation:            Breech  Amniotic Fluid  AFI FV:      Within normal limits  AFI Sum(cm)     %Tile       Largest Pocket(cm)  12.31           40          4.03  RUQ(cm)       RLQ(cm)       LUQ(cm)        LLQ(cm)  2.92          4.03          2.88           2.48 ---------------------------------------------------------------------- Biophysical Evaluation  Amniotic F.V:   Within normal limits       F. Tone:         Observed  F. Movement:  Observed                   Score:           8/8  F. Breathing:   Observed ---------------------------------------------------------------------- OB History  Gravidity:    3         Term:   2        Prem:   0        SAB:   0  TOP:          0       Ectopic:  0        Living: 2 ---------------------------------------------------------------------- Gestational Age  LMP:           38w 2d        Date:  08/06/18                 EDD:   05/13/19  Best:          36w 4d     Det. ByMarcella Dubs         EDD:   05/25/19                                      (11/28/18) ----------------------------------------------------------------------  Anatomy  Thoracic:              Appears normal         Bladder:                Appears normal  Stomach:               Appears normal, left                         sided ---------------------------------------------------------------------- Cervix Uterus Adnexa  Cervix  Not visualized (advanced GA >24wks) ---------------------------------------------------------------------- Impression  Amniotic fluid is normal and good fetal activity is seen.  Antenatal testing is reassuring. BPP 8/8. ---------------------------------------------------------------------- Recommendations  -Continue weekly BPP till delivery. ----------------------------------------------------------------------                  Noralee Space, MD Electronically Signed Final Report   05/01/2019 01:09 pm ----------------------------------------------------------------------  [place] Mfm Fetal Bpp Wo Non Stress  Result Date: 04/24/2019 ----------------------------------------------------------------------  OBSTETRICS REPORT                       (Signed Final 04/24/2019 04:47 pm) ---------------------------------------------------------------------- Patient Info  ID #:       [id number]                          D.O.B.:  07-09-1991 (28 yrs)  Name:       Valerie Adkins-                  Visit Date: 04/24/2019 07:55 am              [place] ---------------------------------------------------------------------- Performed By  Performed By:     Eden Lathe BS      Secondary Phy.:   [facility]                    RDMS RVT  Attending:        Lin Landsman      Address:          [address]  [place]                                                             Road                                                             [address]                                                             [place]                                                             [id number]  Referred By:      Adam PhenixJAMES G ARNOLD         Location:         [facility]                     MD                                       Fetal Care  Ref. Address:     [address]                    [place], [place]                    [id number] ---------------------------------------------------------------------- Orders   #  Description                          Code         Ordered By   1  US MFM FETAL BPP WO NON              [id number]     CORENTHIAN      STRESS                                            BOOKER  ----------------------------------------------------------------------   #  Order #                    Accession #                 Episode #   1  [id number]  [id number]                  [id number]  ---------------------------------------------------------------------- Indications   Encounter for other antenatal screening        Z36.2   follow-up (Low Risk NIPS)   Maternal morbid obesity (BMI 55)               O99.210 E66.01   Hypertension - Chronic/Pre-existing (ASA)      O10.019   Previous cesarean delivery, antepartum x 2     O34.219   [redacted] weeks gestation of pregnancy                Z3A.35  ---------------------------------------------------------------------- Vital Signs                                                 Height:        5'4" ---------------------------------------------------------------------- Fetal Evaluation  Num Of Fetuses:         1  Fetal Heart Rate(bpm):  141  Cardiac Activity:       Observed  Presentation:           Cephalic  Amniotic Fluid  AFI FV:      Within normal limits  AFI Sum(cm)     %Tile       Largest Pocket(cm)  8.76            12          5.66  RUQ(cm)       RLQ(cm)       LUQ(cm)        LLQ(cm)  5.66          3.1           0              0 ---------------------------------------------------------------------- Biophysical Evaluation  Amniotic F.V:   Within normal limits       F. Tone:        Observed  F. Movement:    Observed                   Score:          8/8  F. Breathing:   Observed  ---------------------------------------------------------------------- OB History  Gravidity:    3         Term:   2        Prem:   0        SAB:   0  TOP:          0       Ectopic:  0        Living: 2 ---------------------------------------------------------------------- Gestational Age  LMP:           37w 2d        Date:  08/06/18                 EDD:   05/13/19  Best:          35w 4d     Det. ByMarcella Dubs         EDD:   05/25/19                                      (11/28/18) ---------------------------------------------------------------------- Impression  Biophysical profile 8/8 ---------------------------------------------------------------------- Recommendations  Repeat BPP in 1 weeks  Delivery scheduled 8/8 ----------------------------------------------------------------------               Lin Landsman, MD Electronically Signed Final Report   04/24/2019 04:47 pm ----------------------------------------------------------------------  [place] Mfm Fetal Bpp Wo Non Stress  Result Date: 04/17/2019 ----------------------------------------------------------------------  OBSTETRICS REPORT                       (Signed Final 04/17/2019 01:02 pm) ---------------------------------------------------------------------- Patient Info  ID #:       [id number]                          D.O.B.:  09/02/1991 (28 yrs)  Name:       Valerie Adkins-                  Visit Date: 04/17/2019 09:34 am              [place] ---------------------------------------------------------------------- Performed By  Performed By:     Earley Brooke     Secondary Phy.:    [facility]                    BS, RDMS  Attending:        Noralee Space MD        Address:           [address]                                                              [address]                                                              [place]                                                               [address]  Referred By:      Adam Phenix         Location:          [facility]                    MD                                        Fetal Care  Ref. Address:  [address]                    [place], [place]                    [address] ---------------------------------------------------------------------- Orders   #  Description                          Code         Ordered By   1  [place] MFM OB FOLLOW UP                  [phone]     Lin Landsman   2  [place] MFM FETAL BPP WO NON              76819.01     [place]AN      STRESS                                            BOOKER  ----------------------------------------------------------------------   #  Order #                    Accession #                 Episode #   1  [id number]                  [id number]                  [id number]   2  [id number]                  [id number]                  [id number]  ---------------------------------------------------------------------- Indications   Maternal morbid obesity (BMI 55)               O99.210 E66.01   [redacted] weeks gestation of pregnancy                Z3A.34   Hypertension - Chronic/Pre-existing (ASA)      O10.019   Previous cesarean delivery, antepartum x 2     O34.219   Encounter for other antenatal screening        Z36.2   follow-up (Low Risk NIPS)  ---------------------------------------------------------------------- Vital Signs                                                 Height:        5'4" ---------------------------------------------------------------------- Fetal Evaluation  Num Of Fetuses:          1  Fetal Heart Rate(bpm):   154  Cardiac Activity:        Observed  Presentation:  Cephalic  Placenta:                Posterior Fundal  P. Cord Insertion:       Previously Visualized  Amniotic Fluid  AFI FV:      Within normal limits  AFI Sum(cm)     %Tile       Largest Pocket(cm)  16.33            59          5.29  RUQ(cm)       RLQ(cm)       LUQ(cm)        LLQ(cm)  3.39          5.29          4.99           2.66 ---------------------------------------------------------------------- Biophysical Evaluation  Amniotic F.V:   Pocket => 2 cm two         F. Tone:         Observed                  planes  F. Movement:    Observed                   Score:           8/8  F. Breathing:   Observed ---------------------------------------------------------------------- Biometry  BPD:      85.4  mm     G. Age:  34w 3d         46  %    CI:        73.78   %    70 - 86                                                          FL/HC:       20.6  %    20.1 - 22.3  HC:      315.8  mm     G. Age:  35w 3d         37  %    HC/AC:       1.03       0.93 - 1.11  AC:      306.5  mm     G. Age:  34w 4d         56  %    FL/BPD:      76.1  %    71 - 87  FL:         65  mm     G. Age:  33w 4d         17  %    FL/AC:       21.2  %    20 - 24  HUM:      59.3  mm     G. Age:  34w 2d         58  %  Est. FW:    2415   gm     5 lb 5 oz     39  % ---------------------------------------------------------------------- OB History  Gravidity:    3         Term:   2        Prem:   0  SAB:   0  TOP:          0       Ectopic:  0        Living: 2 ---------------------------------------------------------------------- Gestational Age  LMP:           36w 2d        Date:  08/06/18                 EDD:   05/13/19  U/S Today:     34w 4d                                        EDD:   05/25/19  Best:          34w 4d     Det. ByMarcella Dubs         EDD:   05/25/19                                      (11/28/18) ---------------------------------------------------------------------- Anatomy  Cranium:               Appears normal         Aortic Arch:            Previously seen  Cavum:                 Previously seen        Ductal Arch:            Previously seen  Ventricles:            Appears normal         Diaphragm:              Previously seen   Choroid Plexus:        Previously seen        Stomach:                Appears normal, left                                                                        sided  Cerebellum:            Previously seen        Abdomen:                Appears normal  Posterior Fossa:       Previously seen        Abdominal Wall:         Previously seen  Nuchal Fold:           Previously seen        Cord Vessels:           Appears normal (3  vessel cord)  Face:                  Orbits and profile     Kidneys:                Appear normal                         previously seen  Lips:                  Previously seen        Bladder:                Appears normal  Thoracic:              Appears normal         Spine:                  Previously seen  Heart:                 Echogenic focus        Upper Extremities:      Previously seen                         in LV prev seen  RVOT:                  Previously seen        Lower Extremities:      Previously seen  LVOT:                  Previously seen  Other:  Female gender previously seen. Heels previously visualized.          Technically difficult due to maternal habitus and fetal position. ---------------------------------------------------------------------- Cervix Uterus Adnexa  Cervix  Not visualized (advanced GA >24wks) ---------------------------------------------------------------------- Impression  Amniotic fluid is normal and good fetal activity is seen. Fetal  growth is appropriate for gestational age. ---------------------------------------------------------------------- Recommendations  An appointment was made for her to return in 4 weeks for  fetal growth assessment. ----------------------------------------------------------------------                  Noralee Space, MD Electronically Signed Final Report   04/17/2019 01:02 pm ----------------------------------------------------------------------  [place]  Mfm Ob Follow Up  Result Date: 04/17/2019 ----------------------------------------------------------------------  OBSTETRICS REPORT                       (Signed Final 04/17/2019 01:02 pm) ---------------------------------------------------------------------- Patient Info  ID #:       [id number]                          D.O.B.:  1991/01/04 (28 yrs)  Name:       Valerie Adkins-                  Visit Date: 04/17/2019 09:34 am              [place] ---------------------------------------------------------------------- Performed By  Performed By:     Earley Brooke     Secondary Phy.:    [facility]                    BS, RDMS  Attending:        Noralee Space MD        Address:  [address]                                                              [address]                                                              [place]                                                              [address]  Referred By:      Adam Phenix         Location:          [facility]                    MD                                        Fetal Care  Ref. Address:     [address]                    [place], [place]                    [address] ---------------------------------------------------------------------- Orders   #  Description                          Code         Ordered By   1  [place] MFM OB FOLLOW UP                  [phone]     Lin Landsman   2  [place] MFM FETAL BPP WO NON              47829.56     [place]      STRESS  BOOKER  ----------------------------------------------------------------------   #  Order #                    Accession #                 Episode #   1  [id number]                  [id number]                  [id number]   2  [id number]                  [id number]                  [id number]   ---------------------------------------------------------------------- Indications   Maternal morbid obesity (BMI 55)               O99.210 E66.01   [redacted] weeks gestation of pregnancy                Z3A.34   Hypertension - Chronic/Pre-existing (ASA)      O10.019   Previous cesarean delivery, antepartum x 2     O34.219   Encounter for other antenatal screening        Z36.2   follow-up (Low Risk NIPS)  ---------------------------------------------------------------------- Vital Signs                                                 Height:        5'4" ---------------------------------------------------------------------- Fetal Evaluation  Num Of Fetuses:          1  Fetal Heart Rate(bpm):   154  Cardiac Activity:        Observed  Presentation:            Cephalic  Placenta:                Posterior Fundal  P. Cord Insertion:       Previously Visualized  Amniotic Fluid  AFI FV:      Within normal limits  AFI Sum(cm)     %Tile       Largest Pocket(cm)  16.33           59          5.29  RUQ(cm)       RLQ(cm)       LUQ(cm)        LLQ(cm)  3.39          5.29          4.99           2.66 ---------------------------------------------------------------------- Biophysical Evaluation  Amniotic F.V:   Pocket => 2 cm two         F. Tone:         Observed                  planes  F. Movement:    Observed                   Score:           8/8  F. Breathing:   Observed ---------------------------------------------------------------------- Biometry  BPD:      85.4  mm     G. Age:  34w 3d         46  %  CI:        73.78   %    70 - 86                                                          FL/HC:       20.6  %    20.1 - 22.3  HC:      315.8  mm     G. Age:  35w 3d         37  %    HC/AC:       1.03       0.93 - 1.11  AC:      306.5  mm     G. Age:  34w 4d         56  %    FL/BPD:      76.1  %    71 - 87  FL:         65  mm     G. Age:  33w 4d         17  %    FL/AC:       21.2  %    20 - 24  HUM:      59.3  mm     G. Age:  34w 2d          58  %  Est. FW:    2415   gm     5 lb 5 oz     39  % ---------------------------------------------------------------------- OB History  Gravidity:    3         Term:   2        Prem:   0        SAB:   0  TOP:          0       Ectopic:  0        Living: 2 ---------------------------------------------------------------------- Gestational Age  LMP:           36w 2d        Date:  08/06/18                 EDD:   05/13/19  U/S Today:     34w 4d                                        EDD:   05/25/19  Best:          34w 4d     Det. ByMarcella Dubs:  Early Ultrasound         EDD:   05/25/19                                      (11/28/18) ---------------------------------------------------------------------- Anatomy  Cranium:               Appears normal         Aortic Arch:            Previously seen  Cavum:  Previously seen        Ductal Arch:            Previously seen  Ventricles:            Appears normal         Diaphragm:              Previously seen  Choroid Plexus:        Previously seen        Stomach:                Appears normal, left                                                                        sided  Cerebellum:            Previously seen        Abdomen:                Appears normal  Posterior Fossa:       Previously seen        Abdominal Wall:         Previously seen  Nuchal Fold:           Previously seen        Cord Vessels:           Appears normal (3                                                                        vessel cord)  Face:                  Orbits and profile     Kidneys:                Appear normal                         previously seen  Lips:                  Previously seen        Bladder:                Appears normal  Thoracic:              Appears normal         Spine:                  Previously seen  Heart:                 Echogenic focus        Upper Extremities:      Previously seen                         in LV prev seen  RVOT:                  Previously seen  Lower Extremities:      Previously seen  LVOT:                  Previously seen  Other:  Female gender previously seen. Heels previously visualized.          Technically difficult due to maternal habitus and fetal position. ---------------------------------------------------------------------- Cervix Uterus Adnexa  Cervix  Not visualized (advanced GA >24wks) ---------------------------------------------------------------------- Impression  Amniotic fluid is normal and good fetal activity is seen. Fetal  growth is appropriate for gestational age. ---------------------------------------------------------------------- Recommendations  An appointment was made for her to return in 4 weeks for  fetal growth assessment. ----------------------------------------------------------------------                  Noralee Space, MD Electronically Signed Final Report   04/17/2019 01:02 pm ----------------------------------------------------------------------   Assessment and Plan:  Pregnancy: [id number] at [redacted]w[redacted]d 1. Supervision of high risk pregnancy in third trimester  2. History of cesarean section x 2 - repeat C/S scheduled for 05-18-2019 - Liletta IUD insertion to be done with C/S  3. History of gestational hypertension  4. Maternal morbid obesity, antepartum (HCC)   Term labor symptoms and general obstetric precautions including but not limited to vaginal bleeding, contractions, leaking of fluid and fetal movement were reviewed in detail with the patient. I discussed the assessment and treatment plan with the patient. The patient was provided an opportunity to ask questions and all were answered. The patient agreed with the plan and demonstrated an understanding of the instructions. The patient was advised to call back or seek an in-person office evaluation/go to MAU at [facility] for any urgent or concerning symptoms. Please refer to After Visit Summary for other counseling recommendations.    I provided 10 minutes of face-to-face time during this encounter.  Return in about 2 weeks (around 05/28/2019) for Postpa.  Future Appointments  Date Time Provider Department Center  05/15/2019 10:00 AM [facility]-MFC NURSE [facility]-MFC MFC-US  05/15/2019 10:00 AM [facility]-MFC [place] 3 WH-MFCUS MFC-US  05/16/2019  7:30 AM [facility]-MAU 1 MC-INDC None    Coral Ceo, MD Center for [facility], [facility] 05-14-2019

## 2019-05-15 ENCOUNTER — Ambulatory Visit (HOSPITAL_COMMUNITY): Payer: Medicare Other | Admitting: *Deleted

## 2019-05-15 ENCOUNTER — Ambulatory Visit (HOSPITAL_BASED_OUTPATIENT_CLINIC_OR_DEPARTMENT_OTHER)
Admission: RE | Admit: 2019-05-15 | Discharge: 2019-05-15 | Disposition: A | Discharge status: Home / Self Care | Payer: Medicare Other | Source: Ambulatory Visit | Attending: Obstetrics and Gynecology | Admitting: Obstetrics and Gynecology

## 2019-05-15 ENCOUNTER — Other Ambulatory Visit (HOSPITAL_COMMUNITY)
Admission: RE | Admit: 2019-05-15 | Discharge: 2019-05-15 | Disposition: A | Discharge status: Home / Self Care | Payer: Medicare Other | Source: Ambulatory Visit

## 2019-05-15 VITALS — BP 132/96 | HR 99 | Temp 98.5°F

## 2019-05-15 DIAGNOSIS — O99213 Obesity complicating pregnancy, third trimester: Secondary | ICD-10-CM | POA: Diagnosis not present

## 2019-05-15 DIAGNOSIS — O34219 Maternal care for unspecified type scar from previous cesarean delivery: Secondary | ICD-10-CM

## 2019-05-15 DIAGNOSIS — Z6841 Body Mass Index (BMI) 40.0 and over, adult: Secondary | ICD-10-CM

## 2019-05-15 DIAGNOSIS — O10013 Pre-existing essential hypertension complicating pregnancy, third trimester: Secondary | ICD-10-CM | POA: Diagnosis not present

## 2019-05-15 DIAGNOSIS — O10919 Unspecified pre-existing hypertension complicating pregnancy, unspecified trimester: Secondary | ICD-10-CM | POA: Insufficient documentation

## 2019-05-15 DIAGNOSIS — Z362 Encounter for other antenatal screening follow-up: Secondary | ICD-10-CM | POA: Diagnosis not present

## 2019-05-15 DIAGNOSIS — Z3A38 38 weeks gestation of pregnancy: Secondary | ICD-10-CM

## 2019-05-16 ENCOUNTER — Other Ambulatory Visit (HOSPITAL_COMMUNITY): Payer: Medicare Other

## 2019-05-16 ENCOUNTER — Other Ambulatory Visit: Payer: Self-pay

## 2019-05-16 ENCOUNTER — Other Ambulatory Visit (HOSPITAL_COMMUNITY)
Admission: RE | Admit: 2019-05-16 | Discharge: 2019-05-16 | Disposition: A | Discharge status: Home / Self Care | Payer: Medicare Other | Source: Ambulatory Visit | Attending: Obstetrics and Gynecology | Admitting: Obstetrics and Gynecology

## 2019-05-16 DIAGNOSIS — Z01812 Encounter for preprocedural laboratory examination: Secondary | ICD-10-CM | POA: Insufficient documentation

## 2019-05-16 DIAGNOSIS — Z20828 Contact with and (suspected) exposure to other viral communicable diseases: Secondary | ICD-10-CM | POA: Insufficient documentation

## 2019-05-16 LAB — SARS CORONAVIRUS 2 (TAT 6-24 HRS): SARS Coronavirus 2: NEGATIVE

## 2019-05-16 NOTE — MAU Note (Signed)
Swab collected without difficulty. 

## 2019-05-17 NOTE — H&P (Signed)
Valerie Adkins is an 28 y.o. [id number] [redacted]w[redacted]d female.   Chief Complaint: Previous C-section HPI: Desires RCS at 34 wks  Past Medical History:  Diagnosis Date  . Hypertension   . Morbid obesity ([place])     Past Surgical History:  Procedure Laterality Date  . CESAREAN SECTION    . CHOLECYSTECTOMY      Family History  Adopted: Yes  Problem Relation Age of Onset  . Healthy Mother   . Healthy Father    Social History:  reports that she has never smoked. She has never used smokeless tobacco. She reports that she does not drink alcohol or use drugs.    Allergies  Allergen Reactions  . Bee Venom Swelling    No medications prior to admission.     A comprehensive review of systems was negative.  Last menstrual period 08/06/2018. General appearance: alert, cooperative and appears stated age Head: Normocephalic, without obvious abnormality, atraumatic Neck: supple, symmetrical, trachea midline Lungs: normal effort Heart: regular rate and rhythm Abdomen: gravid, Non-tender Extremities: Homans sign is negative, no sign of DVT Skin: Skin color, texture, turgor normal. No rashes or lesions Neurologic: Grossly normal   Lab Results  Component Value Date   WBC 7.2 02/28/2019   HGB 11.8 02/28/2019   HCT 35.5 02/28/2019   MCV 77 (L) 02/28/2019   PLT 248 02/28/2019         ABO, Rh: O/Positive/-- (02/05 1001)  Antibody: Negative (02/05 1001)  Rubella: 6.67 (02/05 1001)  RPR: Non Reactive (05/21 0948)  HBsAg: Negative (02/05 1001)  HIV: Non Reactive (05/21 0948)  GBS:   Negative    Assessment/Plan Patient Active Problem List   Diagnosis Date Noted  . History of cesarean section 11/14/2018  . History of gestational hypertension 11/14/2018  . Supervision of high-risk pregnancy 10/30/2018  . Maternal morbid obesity, antepartum ([place]) 10/30/2018   For RCS with Liletta placement Risks include but are not limited to bleeding, infection, injury to surrounding  structures, including bowel, bladder and ureters, blood clots, and death.  Likelihood of success is high.   Donnamae Jude 05/17/2019, 4:00 PM

## 2019-05-18 ENCOUNTER — Encounter (HOSPITAL_COMMUNITY)
Admission: AD | Disposition: A | Discharge status: Home / Self Care | Payer: Self-pay | Source: Home / Self Care | Attending: Family Medicine

## 2019-05-18 ENCOUNTER — Inpatient Hospital Stay (HOSPITAL_COMMUNITY): Payer: Medicare Other | Admitting: Anesthesiology

## 2019-05-18 ENCOUNTER — Other Ambulatory Visit: Payer: Self-pay

## 2019-05-18 ENCOUNTER — Encounter (HOSPITAL_COMMUNITY): Payer: Self-pay | Admitting: *Deleted

## 2019-05-18 ENCOUNTER — Inpatient Hospital Stay (HOSPITAL_COMMUNITY)
Admission: AD | Admit: 2019-05-18 | Discharge: 2019-05-21 | DRG: 788 | Disposition: A | Discharge status: Home / Self Care | Payer: Medicare Other | Attending: Family Medicine | Admitting: Family Medicine

## 2019-05-18 DIAGNOSIS — Z3A39 39 weeks gestation of pregnancy: Secondary | ICD-10-CM | POA: Diagnosis not present

## 2019-05-18 DIAGNOSIS — Z3A38 38 weeks gestation of pregnancy: Secondary | ICD-10-CM | POA: Diagnosis not present

## 2019-05-18 DIAGNOSIS — Z30430 Encounter for insertion of intrauterine contraceptive device: Secondary | ICD-10-CM

## 2019-05-18 DIAGNOSIS — O34211 Maternal care for low transverse scar from previous cesarean delivery: Principal | ICD-10-CM | POA: Diagnosis present

## 2019-05-18 DIAGNOSIS — O99214 Obesity complicating childbirth: Secondary | ICD-10-CM | POA: Diagnosis present

## 2019-05-18 DIAGNOSIS — Z98891 History of uterine scar from previous surgery: Secondary | ICD-10-CM

## 2019-05-18 LAB — ABO/RH: ABO/RH(D): O POS

## 2019-05-18 LAB — CBC
HCT: 35.4 % — ABNORMAL LOW (ref 36.0–46.0)
Hemoglobin: 11.4 g/dL — ABNORMAL LOW (ref 12.0–15.0)
MCH: 25 pg — ABNORMAL LOW (ref 26.0–34.0)
MCHC: 32.2 g/dL (ref 30.0–36.0)
MCV: 77.6 fL — ABNORMAL LOW (ref 80.0–100.0)
Platelets: 203 10*3/uL (ref 150–400)
RBC: 4.56 MIL/uL (ref 3.87–5.11)
RDW: 15 % (ref 11.5–15.5)
WBC: 6.6 10*3/uL (ref 4.0–10.5)
nRBC: 0 % (ref 0.0–0.2)

## 2019-05-18 LAB — CREATININE, SERUM
Creatinine, Ser: 0.7 mg/dL (ref 0.44–1.00)
GFR calc Af Amer: >60 mL/min (ref >60)
GFR calc non Af Amer: >60 mL/min (ref >60)

## 2019-05-18 LAB — TYPE AND SCREEN
ABO/RH(D): O POS
Antibody Screen: NEGATIVE

## 2019-05-18 SURGERY — Surgical Case
Anesthesia: Spinal | Site: Abdomen | Wound class: Clean

## 2019-05-18 MED ORDER — ONDANSETRON HCL 4 MG/2ML IJ SOLN: 4.0000 mg | Freq: Once | INTRAMUSCULAR | Status: DC | PRN

## 2019-05-18 MED ORDER — KETOROLAC TROMETHAMINE 30 MG/ML IJ SOLN: 30.0000 mg | Freq: Four times a day (QID) | INTRAMUSCULAR | Status: AC | PRN

## 2019-05-18 MED ORDER — SIMETHICONE 80 MG PO CHEW
80.0000 mg | CHEWABLE_TABLET | ORAL | Status: DC
  Administered 2019-05-19 – 2019-05-20 (×3): 80 mg via ORAL
  Filled 2019-05-18 (×3): qty 1

## 2019-05-18 MED ORDER — BUPIVACAINE IN DEXTROSE 0.75-8.25 % IT SOLN
INTRATHECAL | Status: DC | PRN
  Administered 2019-05-18: 1.6 mL via INTRATHECAL

## 2019-05-18 MED ORDER — OXYTOCIN 40 UNITS IN NORMAL SALINE INFUSION - SIMPLE MED
INTRAVENOUS | Status: AC
  Filled 2019-05-18: qty 1000

## 2019-05-18 MED ORDER — SODIUM CHLORIDE 0.9 % IR SOLN
Status: DC | PRN
  Administered 2019-05-18: 1

## 2019-05-18 MED ORDER — SCOPOLAMINE 1 MG/3DAYS TD PT72: 1.0000 | MEDICATED_PATCH | Freq: Once | TRANSDERMAL | Status: DC

## 2019-05-18 MED ORDER — KETOROLAC TROMETHAMINE 30 MG/ML IJ SOLN
INTRAMUSCULAR | Status: AC
  Filled 2019-05-18: qty 1

## 2019-05-18 MED ORDER — PHENYLEPHRINE HCL-NACL 20-0.9 MG/250ML-% IV SOLN
INTRAVENOUS | Status: DC | PRN
  Administered 2019-05-18: 60 ug/min via INTRAVENOUS

## 2019-05-18 MED ORDER — SODIUM CHLORIDE 0.9 % IV SOLN
INTRAVENOUS | Status: DC | PRN
  Administered 2019-05-18: 11:00:00 40 [IU] via INTRAVENOUS

## 2019-05-18 MED ORDER — ACETAMINOPHEN 500 MG PO TABS
ORAL_TABLET | ORAL | Status: AC
  Filled 2019-05-18: qty 2

## 2019-05-18 MED ORDER — DIPHENHYDRAMINE HCL 50 MG/ML IJ SOLN: 12.5000 mg | INTRAMUSCULAR | Status: DC | PRN

## 2019-05-18 MED ORDER — STERILE WATER FOR IRRIGATION IR SOLN
Status: DC | PRN
  Administered 2019-05-18: 1

## 2019-05-18 MED ORDER — NALBUPHINE HCL 10 MG/ML IJ SOLN: 5.0000 mg | INTRAMUSCULAR | Status: DC | PRN

## 2019-05-18 MED ORDER — FENTANYL CITRATE (PF) 100 MCG/2ML IJ SOLN: 25.0000 ug | INTRAMUSCULAR | Status: DC | PRN

## 2019-05-18 MED ORDER — ONDANSETRON HCL 4 MG/2ML IJ SOLN
INTRAMUSCULAR | Status: AC
  Filled 2019-05-18: qty 2

## 2019-05-18 MED ORDER — OXYCODONE HCL 5 MG PO TABS: 5.0000 mg | ORAL_TABLET | Freq: Once | ORAL | Status: DC | PRN

## 2019-05-18 MED ORDER — SODIUM CHLORIDE 0.9% FLUSH: 3.0000 mL | INTRAVENOUS | Status: DC | PRN

## 2019-05-18 MED ORDER — COCONUT OIL OIL: 1.0000 "application " | TOPICAL_OIL | Status: DC | PRN

## 2019-05-18 MED ORDER — DEXTROSE IN LACTATED RINGERS 5 % IV SOLN
INTRAVENOUS | Status: DC
  Administered 2019-05-19 (×2): via INTRAVENOUS

## 2019-05-18 MED ORDER — BUPIVACAINE HCL (PF) 0.25 % IJ SOLN
INTRAMUSCULAR | Status: DC | PRN
  Administered 2019-05-18: 30 mL

## 2019-05-18 MED ORDER — LEVONORGESTREL 19.5 MCG/DAY IU IUD
INTRAUTERINE_SYSTEM | INTRAUTERINE | Status: AC
  Administered 2019-05-18: 1
  Filled 2019-05-18: qty 1

## 2019-05-18 MED ORDER — IBUPROFEN 800 MG PO TABS
800.0000 mg | ORAL_TABLET | Freq: Three times a day (TID) | ORAL | Status: AC
  Administered 2019-05-18 – 2019-05-21 (×8): 800 mg via ORAL
  Filled 2019-05-18 (×8): qty 1

## 2019-05-18 MED ORDER — ACETAMINOPHEN 325 MG PO TABS: 325.0000 mg | ORAL_TABLET | ORAL | Status: DC | PRN

## 2019-05-18 MED ORDER — GABAPENTIN 100 MG PO CAPS
100.0000 mg | ORAL_CAPSULE | Freq: Two times a day (BID) | ORAL | Status: DC
  Administered 2019-05-18 – 2019-05-21 (×6): 100 mg via ORAL
  Filled 2019-05-18 (×6): qty 1

## 2019-05-18 MED ORDER — NALBUPHINE HCL 10 MG/ML IJ SOLN: 5.0000 mg | Freq: Once | INTRAMUSCULAR | Status: DC | PRN

## 2019-05-18 MED ORDER — TETANUS-DIPHTH-ACELL PERTUSSIS 5-2.5-18.5 LF-MCG/0.5 IM SUSP: 0.5000 mL | Freq: Once | INTRAMUSCULAR | Status: DC

## 2019-05-18 MED ORDER — SODIUM CHLORIDE 0.9 % IV SOLN
INTRAVENOUS | Status: DC | PRN
  Administered 2019-05-18: 11:00:00 via INTRAVENOUS

## 2019-05-18 MED ORDER — KETOROLAC TROMETHAMINE 30 MG/ML IJ SOLN
30.0000 mg | Freq: Four times a day (QID) | INTRAMUSCULAR | Status: AC | PRN
  Administered 2019-05-18: 30 mg via INTRAMUSCULAR

## 2019-05-18 MED ORDER — NALOXONE HCL 4 MG/10ML IJ SOLN
1.0000 ug/kg/h | INTRAVENOUS | Status: DC | PRN
  Filled 2019-05-18: qty 5

## 2019-05-18 MED ORDER — LACTATED RINGERS IV SOLN
INTRAVENOUS | Status: DC
  Administered 2019-05-18 (×2): via INTRAVENOUS

## 2019-05-18 MED ORDER — DIPHENHYDRAMINE HCL 25 MG PO CAPS: 25.0000 mg | ORAL_CAPSULE | Freq: Four times a day (QID) | ORAL | Status: DC | PRN

## 2019-05-18 MED ORDER — DEXAMETHASONE SODIUM PHOSPHATE 10 MG/ML IJ SOLN
INTRAMUSCULAR | Status: DC | PRN
  Administered 2019-05-18: 10 mg via INTRAVENOUS

## 2019-05-18 MED ORDER — SIMETHICONE 80 MG PO CHEW
80.0000 mg | CHEWABLE_TABLET | ORAL | Status: DC | PRN
  Administered 2019-05-18: 80 mg via ORAL

## 2019-05-18 MED ORDER — ACETAMINOPHEN 500 MG PO TABS
1000.0000 mg | ORAL_TABLET | ORAL | Status: AC
  Administered 2019-05-18: 1000 mg via ORAL

## 2019-05-18 MED ORDER — DEXTROSE 5 % IV SOLN
3.0000 g | INTRAVENOUS | Status: AC
  Administered 2019-05-18: 10:00:00 3 g via INTRAVENOUS
  Filled 2019-05-18: qty 3000

## 2019-05-18 MED ORDER — MEASLES, MUMPS & RUBELLA VAC IJ SOLR: 0.5000 mL | Freq: Once | INTRAMUSCULAR | Status: DC

## 2019-05-18 MED ORDER — MEPERIDINE HCL 25 MG/ML IJ SOLN: 6.2500 mg | INTRAMUSCULAR | Status: DC | PRN

## 2019-05-18 MED ORDER — MORPHINE SULFATE (PF) 0.5 MG/ML IJ SOLN
INTRAMUSCULAR | Status: AC
  Filled 2019-05-18: qty 10

## 2019-05-18 MED ORDER — ONDANSETRON HCL 4 MG/2ML IJ SOLN
INTRAMUSCULAR | Status: DC | PRN
  Administered 2019-05-18: 4 mg via INTRAVENOUS

## 2019-05-18 MED ORDER — FENTANYL CITRATE (PF) 100 MCG/2ML IJ SOLN
INTRAMUSCULAR | Status: DC | PRN
  Administered 2019-05-18: 15 ug via INTRATHECAL

## 2019-05-18 MED ORDER — ZOLPIDEM TARTRATE 5 MG PO TABS: 5.0000 mg | ORAL_TABLET | Freq: Every evening | ORAL | Status: DC | PRN

## 2019-05-18 MED ORDER — SIMETHICONE 80 MG PO CHEW
80.0000 mg | CHEWABLE_TABLET | Freq: Three times a day (TID) | ORAL | Status: DC
  Administered 2019-05-19 – 2019-05-21 (×7): 80 mg via ORAL
  Filled 2019-05-18 (×8): qty 1

## 2019-05-18 MED ORDER — MORPHINE SULFATE (PF) 0.5 MG/ML IJ SOLN
INTRAMUSCULAR | Status: DC | PRN
  Administered 2019-05-18: .15 mg via INTRATHECAL

## 2019-05-18 MED ORDER — WITCH HAZEL-GLYCERIN EX PADS: 1.0000 "application " | MEDICATED_PAD | CUTANEOUS | Status: DC | PRN

## 2019-05-18 MED ORDER — OXYTOCIN 40 UNITS IN NORMAL SALINE INFUSION - SIMPLE MED: 2.5000 [IU]/h | INTRAVENOUS | Status: AC

## 2019-05-18 MED ORDER — ACETAMINOPHEN 160 MG/5ML PO SOLN: 325.0000 mg | ORAL | Status: DC | PRN

## 2019-05-18 MED ORDER — DEXAMETHASONE SODIUM PHOSPHATE 10 MG/ML IJ SOLN
INTRAMUSCULAR | Status: AC
  Filled 2019-05-18: qty 1

## 2019-05-18 MED ORDER — PRENATAL MULTIVITAMIN CH
1.0000 | ORAL_TABLET | Freq: Every day | ORAL | Status: DC
  Administered 2019-05-19 – 2019-05-21 (×2): 1 via ORAL
  Filled 2019-05-18 (×2): qty 1

## 2019-05-18 MED ORDER — OXYCODONE HCL 5 MG PO TABS
5.0000 mg | ORAL_TABLET | ORAL | Status: DC | PRN
  Administered 2019-05-19 – 2019-05-20 (×4): 5 mg via ORAL
  Filled 2019-05-18 (×4): qty 1

## 2019-05-18 MED ORDER — DEXTROSE 5 % IV SOLN
INTRAVENOUS | Status: AC
  Filled 2019-05-18: qty 3000

## 2019-05-18 MED ORDER — SENNOSIDES-DOCUSATE SODIUM 8.6-50 MG PO TABS
2.0000 | ORAL_TABLET | ORAL | Status: DC
  Administered 2019-05-19 – 2019-05-20 (×3): 2 via ORAL
  Filled 2019-05-18 (×3): qty 2

## 2019-05-18 MED ORDER — DIBUCAINE (PERIANAL) 1 % EX OINT: 1.0000 "application " | TOPICAL_OINTMENT | CUTANEOUS | Status: DC | PRN

## 2019-05-18 MED ORDER — LIDOCAINE 2% (20 MG/ML) 5 ML SYRINGE
INTRAMUSCULAR | Status: AC
  Filled 2019-05-18: qty 5

## 2019-05-18 MED ORDER — MENTHOL 3 MG MT LOZG: 1.0000 | LOZENGE | OROMUCOSAL | Status: DC | PRN

## 2019-05-18 MED ORDER — ONDANSETRON HCL 4 MG/2ML IJ SOLN: 4.0000 mg | Freq: Three times a day (TID) | INTRAMUSCULAR | Status: DC | PRN

## 2019-05-18 MED ORDER — BUPIVACAINE HCL (PF) 0.25 % IJ SOLN
INTRAMUSCULAR | Status: AC
  Filled 2019-05-18: qty 30

## 2019-05-18 MED ORDER — OXYCODONE HCL 5 MG/5ML PO SOLN: 5.0000 mg | Freq: Once | ORAL | Status: DC | PRN

## 2019-05-18 MED ORDER — NALOXONE HCL 0.4 MG/ML IJ SOLN: 0.4000 mg | INTRAMUSCULAR | Status: DC | PRN

## 2019-05-18 MED ORDER — ASPIRIN EC 81 MG PO TBEC
81.0000 mg | DELAYED_RELEASE_TABLET | Freq: Every day | ORAL | Status: DC
  Administered 2019-05-18 – 2019-05-21 (×4): 81 mg via ORAL
  Filled 2019-05-18 (×4): qty 1

## 2019-05-18 MED ORDER — ENOXAPARIN SODIUM 80 MG/0.8ML ~~LOC~~ SOLN
70.0000 mg | SUBCUTANEOUS | Status: DC
  Administered 2019-05-19 – 2019-05-21 (×3): 70 mg via SUBCUTANEOUS
  Filled 2019-05-18 (×3): qty 0.8

## 2019-05-18 MED ORDER — DIPHENHYDRAMINE HCL 25 MG PO CAPS: 25.0000 mg | ORAL_CAPSULE | ORAL | Status: DC | PRN

## 2019-05-18 MED ORDER — FENTANYL CITRATE (PF) 100 MCG/2ML IJ SOLN
INTRAMUSCULAR | Status: AC
  Filled 2019-05-18: qty 2

## 2019-05-18 MED ORDER — PHENYLEPHRINE HCL-NACL 20-0.9 MG/250ML-% IV SOLN
INTRAVENOUS | Status: AC
  Filled 2019-05-18: qty 250

## 2019-05-18 MED ORDER — GABAPENTIN 100 MG PO CAPS
100.0000 mg | ORAL_CAPSULE | ORAL | Status: AC
  Administered 2019-05-18: 100 mg via ORAL
  Filled 2019-05-18: qty 1

## 2019-05-18 SURGICAL SUPPLY — 34 items
BENZOIN TINCTURE PRP APPL 2/3 (GAUZE/BANDAGES/DRESSINGS) ×3 IMPLANT
CHLORAPREP W/TINT 26ML (MISCELLANEOUS) ×3 IMPLANT
CLAMP CORD UMBIL (MISCELLANEOUS) IMPLANT
CLOSURE WOUND 1/2 X4 (GAUZE/BANDAGES/DRESSINGS) ×1
CLOTH BEACON ORANGE TIMEOUT ST (SAFETY) ×3 IMPLANT
DRSG OPSITE POSTOP 4X10 (GAUZE/BANDAGES/DRESSINGS) ×3 IMPLANT
ELECT REM PT RETURN 9FT ADLT (ELECTROSURGICAL) ×3
ELECTRODE REM PT RTRN 9FT ADLT (ELECTROSURGICAL) ×1 IMPLANT
EXTRACTOR VACUUM M CUP 4 TUBE (SUCTIONS) IMPLANT
EXTRACTOR VACUUM M CUP 4' TUBE (SUCTIONS)
GLOVE BIOGEL PI IND STRL 7.0 (GLOVE) ×2 IMPLANT
GLOVE BIOGEL PI INDICATOR 7.0 (GLOVE) ×4
GLOVE ECLIPSE 7.0 STRL STRAW (GLOVE) ×6 IMPLANT
GOWN STRL REUS W/TWL LRG LVL3 (GOWN DISPOSABLE) ×6 IMPLANT
KIT ABG SYR 3ML LUER SLIP (SYRINGE) IMPLANT
NEEDLE HYPO 22GX1.5 SAFETY (NEEDLE) ×3 IMPLANT
NEEDLE HYPO 25X5/8 SAFETYGLIDE (NEEDLE) IMPLANT
NS IRRIG 1000ML POUR BTL (IV SOLUTION) ×3 IMPLANT
PACK C SECTION WH (CUSTOM PROCEDURE TRAY) ×3 IMPLANT
PAD ABD 7.5X8 STRL (GAUZE/BANDAGES/DRESSINGS) ×3 IMPLANT
PAD OB MATERNITY 4.3X12.25 (PERSONAL CARE ITEMS) ×3 IMPLANT
PENCIL SMOKE EVAC W/HOLSTER (ELECTROSURGICAL) ×3 IMPLANT
RTRCTR C-SECT PINK 25CM LRG (MISCELLANEOUS) ×3 IMPLANT
SPONGE GAUZE 4X4 12PLY STER LF (GAUZE/BANDAGES/DRESSINGS) ×3 IMPLANT
STRIP CLOSURE SKIN 1/2X4 (GAUZE/BANDAGES/DRESSINGS) ×2 IMPLANT
SUT PLAIN 0 NONE (SUTURE) ×3 IMPLANT
SUT PLAIN 2 0 XLH (SUTURE) ×3 IMPLANT
SUT VIC AB 0 CTX 36 (SUTURE) ×6
SUT VIC AB 0 CTX36XBRD ANBCTRL (SUTURE) ×3 IMPLANT
SUT VIC AB 4-0 KS 27 (SUTURE) ×3 IMPLANT
SYR 30ML LL (SYRINGE) ×3 IMPLANT
TOWEL OR 17X24 6PK STRL BLUE (TOWEL DISPOSABLE) ×3 IMPLANT
TRAY FOLEY W/BAG SLVR 14FR LF (SET/KITS/TRAYS/PACK) ×3 IMPLANT
WATER STERILE IRR 1000ML POUR (IV SOLUTION) ×3 IMPLANT

## 2019-05-18 NOTE — Anesthesia Procedure Notes (Addendum)
Spinal  Patient location during procedure: OR Start time: 05/18/2019 10:25 AM End time: 05/18/2019 10:30 AM Staffing Anesthesiologist: Janeece Riggers, MD Preanesthetic Checklist Completed: patient identified, site marked, surgical consent, pre-op evaluation, timeout performed, IV checked, risks and benefits discussed and monitors and equipment checked Spinal Block Patient position: sitting Prep: DuraPrep Patient monitoring: heart rate, cardiac monitor, continuous pulse ox and blood pressure Approach: midline Location: L2-3 Injection technique: single-shot Needle Needle type: Sprotte  Needle gauge: 22 G Needle length: 12.7 cm Assessment Sensory level: T4

## 2019-05-18 NOTE — Op Note (Signed)
Preoperative Diagnosis:  IUP @ [redacted]w[redacted]d, Previous C-section x 2, desire for contraception   Postoperative Diagnosis:  Same  Procedure: Repeat low transverse cesarean section, Liletta IUD placement  Surgeon: Darron Doom, M.D.  Assistant: None  Findings: Viable female infant, APGAR (1 MIN): 8   APGAR (5 MINS): 9, vertex presentation, LOP position, adhesions of bladder to LUS, adhesions of omentum to LUS and peritoneum on left  Estimated blood loss: 4008 cc  Complications: None known  Specimens: Placenta to labor and delivery  Reason for procedure: Briefly, the patient is a 28 y.o. Q7Y1950 [redacted]w[redacted]d who presents for RCS with IUD placement, following 2 prior C-sections  Procedure: Patient is a to the OR where spinal analgesia was administered. She was then placed in a supine position with left lateral tilt. She received 2 g of Ancef and SCDs were in place. A timeout was performed. She was prepped and draped in the usual sterile fashion. A Foley catheter was placed in the bladder. A knife was then used to make a Pfannenstiel incision. This incision was carried out to underlying fascia which was divided in the midline with the knife. The incision was extended laterally, sharply.  The rectus was divided in the midline.  The peritoneal cavity was entered sharply. Adhesions of omentum divided, clamped and ligated. Alexis retractor was placed inside the incision. Bladder reflection taken down. A knife was used to make a low transverse incision on the uterus. This incision was carried down to the amniotic cavity was entered. Fetus was in LOP position. The uterus was rotated to the right and head was difficult to bring out of the incision. Vacuum applied and pulled x 2 with 1 pop-off to complete delivery. Delayed cord clamping x 1 minute. Cord was clamped x 2 and cut. Infant taken to waiting nurse. Cord blood was obtained. Placenta was delivered from the uterus. Uterus was cleaned with dry lap pads. Liletta place  after trimming strings to 15 cm. Uterine incision closed with 0 Vicryl suture in a locked running fashion. Suture and cautery and Arista used to achieve hemostasis of the uterine incision. Alexis retractor was removed from the abdomen. Peritoneal closure was done with 0 Vicryl suture.  Fascia is closed with 0 Vicryl suture in a running fashion. Subcutaneous tissue infused with 30cc 0.25% Marcaine.  Subcutaneous closure was performed with 0 plain suture.  Skin closed using 3-0 Vicryl on a Keith needle.  Steri strips applied, followed by pressure dressing.  All instrument, needle and lap counts were correct x 2.  Patient was awake and taken to PACU stable.  Infant was taken to the transition nursery.   Standley Dakins PrattMD 05/18/2019 11:34 AM

## 2019-05-18 NOTE — Transfer of Care (Signed)
Immediate Anesthesia Transfer of Care Note  Patient: Valerie Adkins  Procedure(s) Performed: CESAREAN SECTION AND IUD INSERTION (N/A Abdomen)  Patient Location: PACU  Anesthesia Type:Spinal  Level of Consciousness: awake, alert  and oriented  Airway & Oxygen Therapy: Patient Spontanous Breathing  Post-op Assessment: Report given to RN and Post -op Vital signs reviewed and stable  Post vital signs: Reviewed and stable  Last Vitals:  Vitals Value Taken Time  BP    Temp    Pulse 78 05/18/19 1139  Resp 9 05/18/19 1139  SpO2 96 % 05/18/19 1139  Vitals shown include unvalidated device data.  Last Pain:  Vitals:   05/18/19 0828  TempSrc: Oral         Complications: No apparent anesthesia complications

## 2019-05-18 NOTE — Interval H&P Note (Signed)
History and Physical Interval Note:  05/18/2019 8:59 AM  Valerie Adkins  has presented today for surgery, with the diagnosis of RCS Undesired Fertility.  The various methods of treatment have been discussed with the patient and family. After consideration of risks, benefits and other options for treatment, the patient has consented to  Procedure(s): CESAREAN SECTION AND IUD INSERTION (N/A) as a surgical intervention.  The patient's history has been reviewed, patient examined, no change in status, stable for surgery.  I have reviewed the patient's chart and labs.  Questions were answered to the patient's satisfaction.     Donnamae Jude

## 2019-05-18 NOTE — Anesthesia Preprocedure Evaluation (Signed)
Anesthesia Evaluation  Patient identified by MRN, date of birth, ID band Patient awake    Reviewed: Allergy & Precautions, H&P , NPO status , Patient's Chart, lab work & pertinent test results, reviewed documented beta blocker date and time   Airway Mallampati: II  TM Distance: >3 FB Neck ROM: full    Dental no notable dental hx.    Pulmonary neg pulmonary ROS,    Pulmonary exam normal breath sounds clear to auscultation       Cardiovascular hypertension, negative cardio ROS Normal cardiovascular exam Rhythm:regular Rate:Normal     Neuro/Psych negative neurological ROS  negative psych ROS   GI/Hepatic negative GI ROS, Neg liver ROS,   Endo/Other  Morbid obesity  Renal/GU negative Renal ROS  negative genitourinary   Musculoskeletal   Abdominal   Peds  Hematology negative hematology ROS (+)   Anesthesia Other Findings   Reproductive/Obstetrics (+) Pregnancy                             Anesthesia Physical Anesthesia Plan  ASA: III  Anesthesia Plan: Spinal   Post-op Pain Management:    Induction:   PONV Risk Score and Plan: 2 and Treatment may vary due to age or medical condition  Airway Management Planned: Natural Airway, Simple Face Mask, Nasal Cannula and Mask  Additional Equipment:   Intra-op Plan:   Post-operative Plan:   Informed Consent: I have reviewed the patients History and Physical, chart, labs and discussed the procedure including the risks, benefits and alternatives for the proposed anesthesia with the patient or authorized representative who has indicated his/her understanding and acceptance.       Plan Discussed with: Anesthesiologist  Anesthesia Plan Comments: (  )        Anesthesia Quick Evaluation

## 2019-05-18 NOTE — Anesthesia Postprocedure Evaluation (Signed)
Anesthesia Post Note  Patient: Clinical research associate  Procedure(s) Performed: CESAREAN SECTION AND IUD INSERTION (N/A Abdomen)     Patient location during evaluation: PACU Anesthesia Type: Spinal Level of consciousness: oriented and awake and alert Pain management: pain level controlled Vital Signs Assessment: post-procedure vital signs reviewed and stable Respiratory status: spontaneous breathing, respiratory function stable and patient connected to nasal cannula oxygen Cardiovascular status: blood pressure returned to baseline and stable Postop Assessment: no headache, no backache and no apparent nausea or vomiting Anesthetic complications: no    Last Vitals:  Vitals:   05/18/19 1600 05/18/19 1700  BP: 130/73 137/77  Pulse: 81 84  Resp: 18 18  Temp: (!) 36.2 C 36.7 C  SpO2: 97% 97%    Last Pain:  Vitals:   05/18/19 1700  TempSrc: Oral  PainSc: 1    Pain Goal:                   Valerie Adkins

## 2019-05-18 NOTE — Discharge Summary (Addendum)
Postpartum Discharge Summary     Patient Name: Valerie Adkins DOB: 1991/04/18 MRN: [id number]  Date of admission: 05/18/2019 Delivering Provider: Reva BoresPRATT, TANYA S   Date of discharge: 05/21/2019  Admitting diagnosis: RCS Undesired Fertility Intrauterine pregnancy: 9063w0d     Secondary diagnosis:  Principal Problem:   History of cesarean section Active Problems:   Status post cesarean section  Additional problems: Morbid Obesity History of gestational Hypertension     Discharge diagnosis: Term Pregnancy Delivered                                                                                                Post partum procedures:IUD placement  Augmentation: n/a  Complications: None  Hospital course:  Sceduled C/S   28 y.o. yo G3P2002 at 7863w0d was admitted to the hospital 05/18/2019 for scheduled cesarean section with the following indication:Elective Repeat.  Membrane Rupture Time/Date: 10:49 AM ,05/18/2019   Patient delivered a Viable infant.05/18/2019  Details of operation can be found in separate operative note. She received a Liletta at time of C-section. Pateint had an uncomplicated postpartum course.  She is ambulating, tolerating a regular diet, passing flatus, and urinating well. Patient is discharged home in stable condition on  05/21/19         Magnesium Sulfate recieved: No BMZ received: No  Physical exam  Vitals:   05/20/19 0901 05/20/19 1447 05/20/19 2207 05/21/19 0530  BP: 134/70 (!) 115/56 (!) 97/48 128/64  Pulse: 86 92 89 84  Resp: 18 18 (!) 22 18  Temp: 98 F (36.7 C) 98.3 F (36.8 C) 99.6 F (37.6 C) 99 F (37.2 C)  TempSrc: Oral Oral Oral Oral  SpO2:  100%    Weight:      Height:       General: alert, cooperative and no distress Lochia: appropriate Uterine Fundus: firm Incision: dressing is crusty and steristrips starting to peel off; changed by RN.  DVT Evaluation: No evidence of DVT seen on physical exam. Labs: Lab Results  Component  Value Date   WBC 6.1 05/20/2019   HGB 10.0 (L) 05/20/2019   HCT 31.3 (L) 05/20/2019   MCV 78.6 (L) 05/20/2019   PLT 182 05/20/2019   CMP Latest Ref Rng & Units 05/20/2019  Glucose 70 - 99 mg/dL 97  BUN 6 - 20 mg/dL 11  Creatinine 4.090.44 - 8.111.00 mg/dL 9.140.72  Sodium 782135 - 956145 mmol/L 138  Potassium 3.5 - 5.1 mmol/L 4.3  Chloride 98 - 111 mmol/L 106  CO2 22 - 32 mmol/L 22  Calcium 8.9 - 10.3 mg/dL 8.9  Total Protein 6.5 - 8.1 g/dL 6.0(L)  Total Bilirubin 0.3 - 1.2 mg/dL 0.4  Alkaline Phos 38 - 126 U/L 93  AST 15 - 41 U/L 24  ALT 0 - 44 U/L 25    Discharge instruction: per After Visit Summary and "Baby and Me Booklet".  After visit meds:  Allergies as of 05/21/2019      Reactions   Bee Venom Swelling      Medication List    STOP taking these medications   aspirin EC  81 MG tablet   Prenatal Vitamins 28-0.8 MG Tabs     TAKE these medications   acetaminophen 325 MG tablet Commonly known as: TYLENOL Take 650 mg by mouth every 6 (six) hours as needed for moderate pain or headache.   amLODipine 5 MG tablet Commonly known as: NORVASC Take 1 tablet (5 mg total) by mouth daily. Start taking on: May 22, 2019   senna-docusate 8.6-50 MG tablet Commonly known as: Senokot-S Take 2 tablets by mouth daily. Start taking on: May 22, 2019   simethicone 80 MG chewable tablet Commonly known as: MYLICON Chew 1 tablet (80 mg total) by mouth 4 (four) times daily - after meals and at bedtime.   Tdap 5-2.5-18.5 LF-MCG/0.5 injection Commonly known as: BOOSTRIX Inject 0.5 mLs into the muscle once for 1 dose.       Diet: No evidence of DVT seen on physical exam.  Activity: Advance as tolerated. Pelvic rest for 6 weeks.   Outpatient follow up:2 weeks Follow up Appt: Future Appointments  Date Time Provider [place]  06/04/2019 11:15 AM Emily Filbert, MD [facility] None  06/24/2019  1:00 PM Sloan Leiter, MD [facility] None   Follow up Visit:   Please schedule this patient  for Postpartum visit in: 6 weeks with the following provider: Any provider may need pap smear--none in EPIC For C/S patients schedule nurse incision check in weeks 2 weeks: yes. Will schedule patient to have virtual BP check in one week.  High risk pregnancy complicated by: obesity, h/o GHTN Delivery mode:  CS Anticipated Birth Control:  IUD placed at c-section PP Procedures needed: Incision check  Schedule Integrated [place] visit: no   -Meds to [facility] consult ordered in order to have patient given her BP medicine before she goes.  -BPs have been well controlled while in patient.  -Will schedule patient to have virtual BP check in one week.    Newborn Data: Live born female  Birth Weight:   APGAR: 6, 9  Newborn Delivery   Birth date/time: 05/18/2019 10:51:00 Delivery type: C-Section, Vacuum Assisted Trial of labor: No C-section categorization: Repeat      Baby Feeding: Bottle and Breast Disposition:home with mother   05/21/2019 Starr [place], CNM

## 2019-05-19 LAB — CBC
HCT: 33.5 % — ABNORMAL LOW (ref 36.0–46.0)
Hemoglobin: 10.6 g/dL — ABNORMAL LOW (ref 12.0–15.0)
MCH: 25.2 pg — ABNORMAL LOW (ref 26.0–34.0)
MCHC: 31.6 g/dL (ref 30.0–36.0)
MCV: 79.6 fL — ABNORMAL LOW (ref 80.0–100.0)
Platelets: 202 10*3/uL (ref 150–400)
RBC: 4.21 MIL/uL (ref 3.87–5.11)
RDW: 14.9 % (ref 11.5–15.5)
WBC: 8.2 10*3/uL (ref 4.0–10.5)
nRBC: 0 % (ref 0.0–0.2)

## 2019-05-19 LAB — RPR: RPR Ser Ql: NONREACTIVE

## 2019-05-19 MED ORDER — LACTATED RINGERS IV BOLUS
500.0000 mL | Freq: Once | INTRAVENOUS | Status: AC
  Administered 2019-05-19: 500 mL via INTRAVENOUS

## 2019-05-19 NOTE — Progress Notes (Signed)
POSTPARTUM PROGRESS NOTE  POD #1  Subjective:  Valerie Adkins is a 28 y.o. G3P3003 s/p rLTCS at [redacted]w[redacted]d.  She reports she doing well. No acute events overnight.  She denies any problems with ambulating, voiding or po intake. Denies nausea or vomiting. She has not passed flatus but is feeling movement in her bowels. Pain is well controlled.  Lochia is appropriate.  Objective: Blood pressure (!) 112/55, pulse (!) 54, temperature 97.7 F (36.5 C), temperature source Oral, resp. rate 18, height 5\' 4"  (1.626 m), weight (!) 147.4 kg, last menstrual period 08/06/2018, SpO2 98 %, unknown if currently breastfeeding.  Physical Exam:  General: alert, cooperative and no distress Chest: no respiratory distress Heart:regular rate, distal pulses intact Abdomen: soft, nontender,  Uterine Fundus: firm, appropriately tender DVT Evaluation: No calf swelling or tenderness Extremities: No LE edema Skin: warm, dry; incision clean/dry/intact w/ pressure dressing in place  Recent Labs    05/18/19 0853 05/19/19 0418  HGB 11.4* 10.6*  HCT 35.4* 33.5*    Assessment/Plan: Valerie Adkins is a 28 y.o. G3P3003 s/p rLTCS at [redacted]w[redacted]d.  POD#1 - Doing welll; pain well controlled. H/H appropriate  Routine postpartum care  OOB, ambulated  Lovenox for VTE prophylaxis Contraception: IUD placed at time of CS  Feeding: Breast   Dispo: Plan for discharge POD#2 or #3.   LOS: 1 day   Phill Myron, D.O. OB Fellow  05/19/2019, 8:17 AM

## 2019-05-20 LAB — COMPREHENSIVE METABOLIC PANEL
ALT: 25 U/L (ref 0–44)
AST: 24 U/L (ref 15–41)
Albumin: 2.3 g/dL — ABNORMAL LOW (ref 3.5–5.0)
Alkaline Phosphatase: 93 U/L (ref 38–126)
Anion gap: 10 (ref 5–15)
BUN: 11 mg/dL (ref 6–20)
CO2: 22 mmol/L (ref 22–32)
Calcium: 8.9 mg/dL (ref 8.9–10.3)
Chloride: 106 mmol/L (ref 98–111)
Creatinine, Ser: 0.72 mg/dL (ref 0.44–1.00)
GFR calc Af Amer: >60 mL/min (ref >60)
GFR calc non Af Amer: >60 mL/min (ref >60)
Glucose, Bld: 97 mg/dL (ref 70–99)
Potassium: 4.3 mmol/L (ref 3.5–5.1)
Sodium: 138 mmol/L (ref 135–145)
Total Bilirubin: 0.4 mg/dL (ref 0.3–1.2)
Total Protein: 6 g/dL — ABNORMAL LOW (ref 6.5–8.1)

## 2019-05-20 LAB — CBC
HCT: 31.3 % — ABNORMAL LOW (ref 36.0–46.0)
Hemoglobin: 10 g/dL — ABNORMAL LOW (ref 12.0–15.0)
MCH: 25.1 pg — ABNORMAL LOW (ref 26.0–34.0)
MCHC: 31.9 g/dL (ref 30.0–36.0)
MCV: 78.6 fL — ABNORMAL LOW (ref 80.0–100.0)
Platelets: 182 10*3/uL (ref 150–400)
RBC: 3.98 MIL/uL (ref 3.87–5.11)
RDW: 15.2 % (ref 11.5–15.5)
WBC: 6.1 10*3/uL (ref 4.0–10.5)
nRBC: 0 % (ref 0.0–0.2)

## 2019-05-20 LAB — PROTEIN / CREATININE RATIO, URINE
Creatinine, Urine: 199.46 mg/dL
Protein Creatinine Ratio: 0.15 mg/mg{Cre} (ref 0.00–0.15)
Total Protein, Urine: 29 mg/dL

## 2019-05-20 MED ORDER — AMLODIPINE BESYLATE 5 MG PO TABS
5.0000 mg | ORAL_TABLET | Freq: Every day | ORAL | Status: DC
  Administered 2019-05-20 – 2019-05-21 (×2): 5 mg via ORAL
  Filled 2019-05-20 (×2): qty 1

## 2019-05-20 MED ORDER — METOCLOPRAMIDE HCL 10 MG PO TABS
10.0000 mg | ORAL_TABLET | Freq: Once | ORAL | Status: AC
  Administered 2019-05-20: 10 mg via ORAL
  Filled 2019-05-20: qty 1

## 2019-05-20 MED ORDER — CYCLOBENZAPRINE HCL 10 MG PO TABS
10.0000 mg | ORAL_TABLET | Freq: Once | ORAL | Status: AC
  Administered 2019-05-20: 10 mg via ORAL
  Filled 2019-05-20: qty 1

## 2019-05-20 MED ORDER — DIPHENHYDRAMINE HCL 25 MG PO CAPS
25.0000 mg | ORAL_CAPSULE | Freq: Once | ORAL | Status: AC
  Administered 2019-05-20: 25 mg via ORAL
  Filled 2019-05-20: qty 1

## 2019-05-20 NOTE — Progress Notes (Signed)
Notified Maryagnes Amos CNM of pt's c/o headache over the course of the night. Initially, pt c/o headache at 2040, with BP 140/69. Medicated with oxycodone and headache down to 6/10 at 2159 when pt was exiting the shower, with BP 120/70 @2220 . Pt with poor sleep the night prior, encouraged to rest and medicated with scheduled motrin. Pt sleeping from midnight to 3am. Pt c/o headache 5/10 still upon awaking at 0305 and medicated with oxycodone. Pt states she doesn't know if baby's phototherapy lights are making her head hurt. BP 146/66. Pt still c/o 5/10 headache at 0341. Denies vision changes. History of GHTN. Orders placed. Will continue to closely monitor.

## 2019-05-20 NOTE — Progress Notes (Signed)
Post Partum Day 2 Subjective: up ad lib, voiding, tolerating PO, + flatus and new onset headache. 6/10   Patient has had ibuprofen and percocet. Headache has not improved. She has ha hx of migraines, and states that she has been admitted for a migraine in the past. She usually takes tylenol/ibuprofen or ends up in the hospital if those don't work.   Objective: Blood pressure 134/70, pulse 86, temperature 98 F (36.7 C), temperature source Oral, resp. rate 18, height 5\' 4"  (1.626 m), weight (!) 147.4 kg, last menstrual period 08/06/2018, SpO2 100 %, unknown if currently breastfeeding.  Physical Exam:  General: alert, cooperative and no distress Lochia: appropriate Uterine Fundus: firm Incision: NA DVT Evaluation: No evidence of DVT seen on physical exam.  Recent Labs    05/19/19 0418 05/20/19 0439  HGB 10.6* 10.0*  HCT 33.5* 31.3*    Assessment/Plan: Plan for discharge tomorrow  Will try: reglan, benadryl and flexeril for headache now Continue norvasc Consider triptan is headache does not improve.    LOS: 2 days   Marcille Buffy DNP, CNM  05/20/19  10:57 AM

## 2019-05-21 MED ORDER — SIMETHICONE 80 MG PO CHEW: 80.0000 mg | CHEWABLE_TABLET | Freq: Three times a day (TID) | ORAL | 0 refills | Status: DC

## 2019-05-21 MED ORDER — TETANUS-DIPHTH-ACELL PERTUSSIS 5-2.5-18.5 LF-MCG/0.5 IM SUSP: 0.5000 mL | Freq: Once | INTRAMUSCULAR | 0 refills | Status: AC

## 2019-05-21 MED ORDER — AMLODIPINE BESYLATE 5 MG PO TABS: 5.0000 mg | ORAL_TABLET | Freq: Every day | ORAL | 0 refills | Status: DC

## 2019-05-21 MED ORDER — SENNOSIDES-DOCUSATE SODIUM 8.6-50 MG PO TABS: 2.0000 | ORAL_TABLET | ORAL | 0 refills | Status: DC

## 2019-05-21 MED FILL — AMLODIPINE BESYLATE 5 MG TA: 5 | 60 days supply | Qty: 60 | Fill #0

## 2019-05-21 NOTE — Progress Notes (Signed)
CSW spoke with Valerie Adkins from [facility] and was advised that they would deliver lights within the next two hours. CSW has given all needed information to [place] at this time.      Virgie Dad Meshia Rau, MSW, LCSW [facility] [place] [facility] [place] [phone]

## 2019-05-21 NOTE — Progress Notes (Signed)
Subjective: Postpartum Day 3: Cesarean Delivery Patient is doing well. Wants to go home today. Reports no complaints. She reports tolerating PO, + flatus, + BM and no problems voiding.    Objective: Vital signs in last 24 hours: Temp:  [98.3 F (36.8 C)-99.6 F (37.6 C)] 99 F (37.2 C) (08/11 0530) Pulse Rate:  [84-92] 84 (08/11 0530) Resp:  [18-22] 18 (08/11 0530) BP: (97-128)/(48-64) 128/64 (08/11 0530) SpO2:  [100 %] 100 % (08/10 1447)  Physical Exam:  General: alert, cooperative and no distress Lochia: appropriate Uterine Fundus: firm Incision: healing well, no dehiscence, no significant erythema, moderate amount of dark brown, old-appearing blood noted on dressing.  DVT Evaluation: No evidence of DVT seen on physical exam. Negative Homan's sign. No cords or calf tenderness. No significant calf/ankle edema.  Recent Labs    05/19/19 0418 05/20/19 0439  HGB 10.6* 10.0*  HCT 33.5* 31.3*    Assessment/Plan: Status post Cesarean section. Doing well postoperatively.  Dressing change prior to discharge.  Discharge home with standard precautions and return to office in 2 weeks for incision and BP check.   Renee Harder, SNM 05/21/2019, 10:24 AM

## 2019-05-21 NOTE — Progress Notes (Signed)
LTCS honeycomb dressing is changed with aseptic technique. Pt tolerated procedure well.

## 2019-05-23 ENCOUNTER — Other Ambulatory Visit: Payer: Self-pay | Admitting: Obstetrics

## 2019-05-23 ENCOUNTER — Telehealth: Payer: Self-pay

## 2019-05-23 DIAGNOSIS — G8918 Other acute postprocedural pain: Secondary | ICD-10-CM

## 2019-05-23 MED ORDER — IBUPROFEN 800 MG PO TABS: 800.0000 mg | ORAL_TABLET | Freq: Three times a day (TID) | ORAL | 5 refills | Status: DC | PRN

## 2019-05-23 MED ORDER — OXYCODONE-ACETAMINOPHEN 5-325 MG PO TABS: 1.0000 | ORAL_TABLET | ORAL | 0 refills | Status: DC | PRN

## 2019-05-23 NOTE — Telephone Encounter (Addendum)
TC from pt states pain medication prescription was not given at discharge s/p c/s 05/18/19 nor was she instructed how to manage her pain per pt.   She took OTC IB 400 mg at home with minimal relief. No pain meds on file confirmed with [facility]. Consulted with provider, pain meds sent today for pain to take.

## 2019-05-28 ENCOUNTER — Telehealth: Payer: Self-pay

## 2019-05-28 NOTE — Telephone Encounter (Signed)
Return call to pt regarding HA Pt states she has had a pounding HA x 2 days now. Pt was not ava left detailed message and sent MyChart message.

## 2019-05-29 ENCOUNTER — Ambulatory Visit (INDEPENDENT_AMBULATORY_CARE_PROVIDER_SITE_OTHER): Payer: Medicare Other

## 2019-05-29 VITALS — BP 137/95

## 2019-05-29 DIAGNOSIS — Z0131 Encounter for examination of blood pressure with abnormal findings: Secondary | ICD-10-CM

## 2019-05-29 DIAGNOSIS — Z98891 History of uterine scar from previous surgery: Secondary | ICD-10-CM

## 2019-05-29 NOTE — Progress Notes (Signed)
Pt is on the phone for virtual BP check. BP is 137/95. Pt is prescribed Norvasc 5 mg daily. She reports she has been having headaches the past couple of days and her BP has been running higher than normal. Consulted with Dr. Jodi Mourning who advises patient go to the hospital for evaluation. I advised patient of this recommendation and she reports that she will be able to go to the hospital when her mother in law gets off work in 2 hours and can watch her children for her. I advised patient to call 911 if symptoms of HA, blurry vision, or sharp abdominal pain happen in the mean time. Pt verbalizes understanding.

## 2019-06-04 ENCOUNTER — Ambulatory Visit: Payer: Medicare Other | Admitting: Obstetrics & Gynecology

## 2019-06-24 ENCOUNTER — Ambulatory Visit (INDEPENDENT_AMBULATORY_CARE_PROVIDER_SITE_OTHER): Payer: Medicare Other | Admitting: Obstetrics and Gynecology

## 2019-06-24 ENCOUNTER — Other Ambulatory Visit (HOSPITAL_COMMUNITY)
Admission: RE | Admit: 2019-06-24 | Discharge: 2019-06-24 | Disposition: A | Discharge status: Home / Self Care | Payer: Medicare Other | Source: Ambulatory Visit | Attending: Obstetrics and Gynecology | Admitting: Obstetrics and Gynecology

## 2019-06-24 ENCOUNTER — Other Ambulatory Visit: Payer: Self-pay

## 2019-06-24 ENCOUNTER — Encounter: Payer: Self-pay | Admitting: Obstetrics and Gynecology

## 2019-06-24 DIAGNOSIS — Z124 Encounter for screening for malignant neoplasm of cervix: Secondary | ICD-10-CM | POA: Diagnosis present

## 2019-06-24 DIAGNOSIS — Z23 Encounter for immunization: Secondary | ICD-10-CM

## 2019-06-24 DIAGNOSIS — Z98891 History of uterine scar from previous surgery: Secondary | ICD-10-CM

## 2019-06-24 NOTE — Progress Notes (Signed)
Obstetrics/Postpartum Visit  Appointment Date: 06/24/2019  OBGYN Clinic: [facility]  Primary Care Provider: Alain MarionPa, [facility]  Chief Complaint:  Chief Complaint  Patient presents with  . Postpartum Care    History of Present Illness: Valerie Adkins is a 28 y.o. African-American G3P3003 (No LMP recorded.), seen for the above chief complaint. Her past medical history is significant for history of gestational hypertension, morbid obesity   She is s/p RCS+IUD placement on 05/18/19 at 39 weeks; she was discharged to home on POD#2. Pregnancy complicated by morbid obesity, history of gestational hypertension.  Complains of nothing, wants to know about intercourse and return to work. States some pulling/pain with incision with lots of walking.  Vaginal bleeding or discharge: Yes , spotting only Breast or formula feeding: bottle Intercourse: No  Contraception: IUD placed at time of CS PP depression s/s: No  Any bowel or bladder issues: No  Pap smear: has not had one done in several years   Review of Systems: Positive for n/a.   Her 12 point review of systems is negative or as noted in the History of Present Illness.  Patient Active Problem List   Diagnosis Date Noted  . Status post cesarean section 05/18/2019  . History of cesarean section 11/14/2018  . History of gestational hypertension 11/14/2018  . Supervision of high-risk pregnancy 10/30/2018  . Maternal morbid obesity, antepartum (HCC) 10/30/2018    Medications Emer Adkins had no medications administered during this visit. Current Outpatient Medications  Medication Sig Dispense Refill  . acetaminophen (TYLENOL) 325 MG tablet Take 650 mg by mouth every 6 (six) hours as needed for moderate pain or headache.    Marland Kitchen. amLODipine (NORVASC) 5 MG tablet Take 1 tablet (5 mg total) by mouth daily. 60 tablet 0  . ibuprofen (ADVIL) 800 MG tablet Take 1 tablet (800 mg total) by mouth every 8 (eight) hours as needed. 30  tablet 5  . oxyCODONE-acetaminophen (PERCOCET/ROXICET) 5-325 MG tablet Take 1 tablet by mouth every 4 (four) hours as needed for severe pain. 20 tablet 0  . senna-docusate (SENOKOT-S) 8.6-50 MG tablet Take 2 tablets by mouth daily. 15 tablet 0  . simethicone (MYLICON) 80 MG chewable tablet Chew 1 tablet (80 mg total) by mouth 4 (four) times daily - after meals and at bedtime. (Patient not taking: Reported on 05/29/2019) 30 tablet 0   No current facility-administered medications for this visit.     Allergies Bee venom  Physical Exam:  BP 128/78   Pulse 82   Wt (!) 317 lb (143.8 kg)   BMI 54.41 kg/m  Body mass index is 54.41 kg/m. General appearance: Well nourished, well developed female in no acute distress.  Cardiovascular: regular rate and rhythm Respiratory:  Normal respiratory effort Abdomen: no masses, hernias; diffusely non tender to palpation, non distended, well healed pfannenstiel incision Breasts: mpt examined. Neuro/Psych:  Normal mood and affect.  Skin:  Warm and dry.   Pelvic exam: is limited by body habitus EGBUS: within normal limits Vagina: within normal limits and with Mild blood in the vault, Cervix:  Difficulty to visualize due to habitus and patient discomfort, anterior lip seen, blind pap taken  PP Depression Screening:   Edinburgh Postnatal Depression Scale - 06/24/19 1311      Edinburgh Postnatal Depression Scale:  In the Past 7 Days   I have been able to laugh and see the funny side of things.  0    I have looked forward with enjoyment to things.  0    I have blamed myself unnecessarily when things went wrong.  0    I have been anxious or worried for no good reason.  0    I have felt scared or panicky for no good reason.  0    Things have been getting on top of me.  0    I have been so unhappy that I have had difficulty sleeping.  0    I have felt sad or miserable.  0    I have been so unhappy that I have been crying.  0    The thought of harming  myself has occurred to me.  0    Edinburgh Postnatal Depression Scale Total  0       Assessment: Patient is a 28 y.o. O2V0350 who is 5 weeks post partum from a RCS+IUD insertion. She is doing well.   Plan:   1. Cervical cancer screening Completed today, somewhat blind - Cytology - PAP( [facility])  2. Postpartum state Doing well Return to work letter given  3. S/P C-section Incision appears well healed  4. Morbid obesity ([place])  5. Need for influenza vaccination Flu vaccine given today  RTC 1 year or sooner prn   K. Arvilla Meres, M.D. Attending Center for [employer])

## 2019-06-26 LAB — CYTOLOGY - PAP: Diagnosis: NEGATIVE

## 2019-08-09 ENCOUNTER — Other Ambulatory Visit: Payer: Self-pay

## 2019-08-09 DIAGNOSIS — Z20822 Contact with and (suspected) exposure to covid-19: Secondary | ICD-10-CM

## 2019-08-11 LAB — NOVEL CORONAVIRUS, NAA: SARS-CoV-2, NAA: NOT DETECTED

## 2019-08-14 IMAGING — US US OB COMP LESS 14 WK
1 series · 15 of 27 positions shown · non-contrast
Comparison: None.

CLINICAL DATA: Supervision of high-risk pregnancy. Uncertain LMP
was 08/06/2018. By LMP patient is 16 weeks 2 days.

EXAM:
OBSTETRIC <14 WK ULTRASOUND
TECHNIQUE: Transabdominal ultrasound was performed for evaluation of the
gestation as well as the maternal uterus and adnexal regions.

[Series 1: us ob comp less 14 wk · 27 acquisitions, 15 frames shown]
[im 1/27]
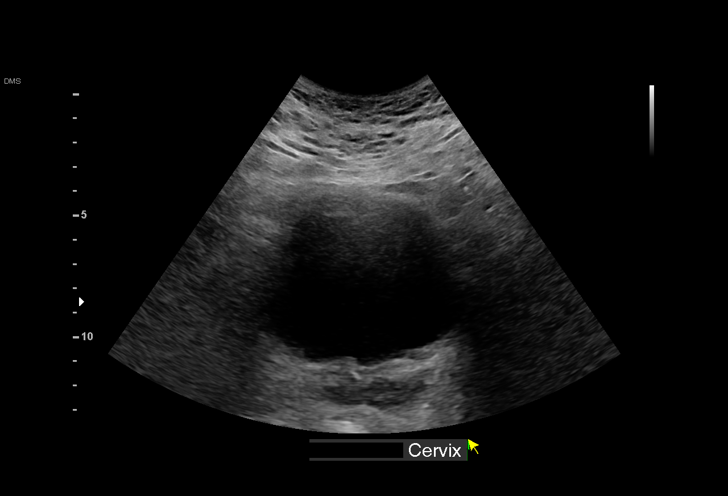
[im 3/27]
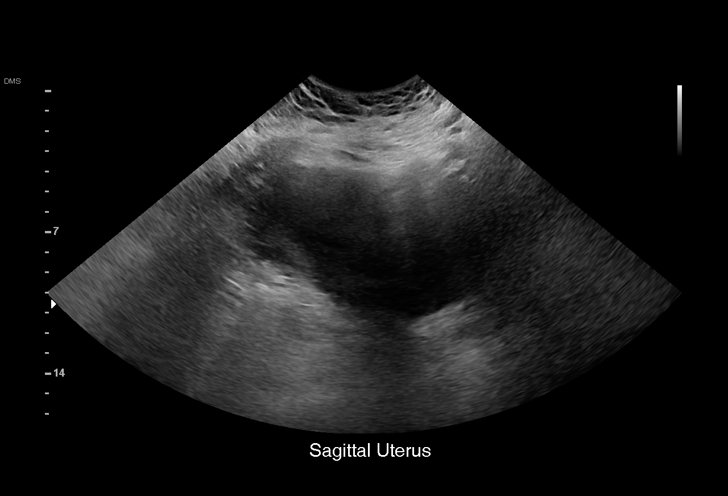
[im 5/27]
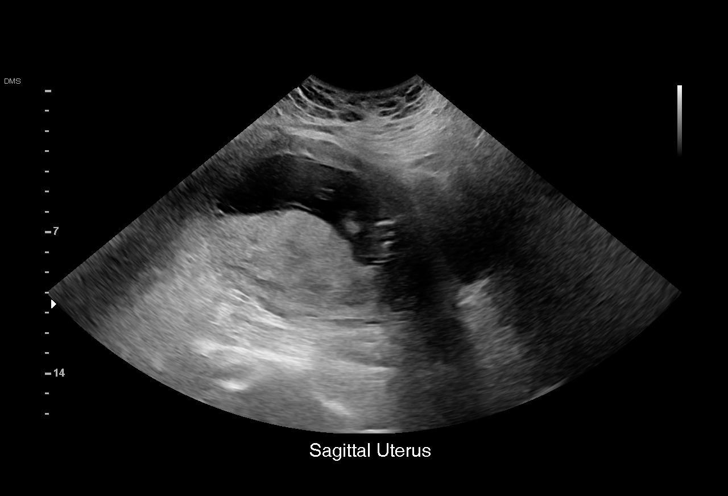
[im 7/27]
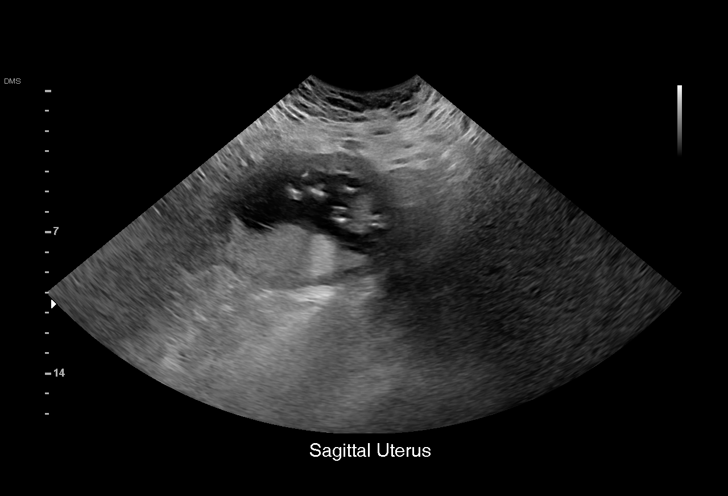
[im 9/27]
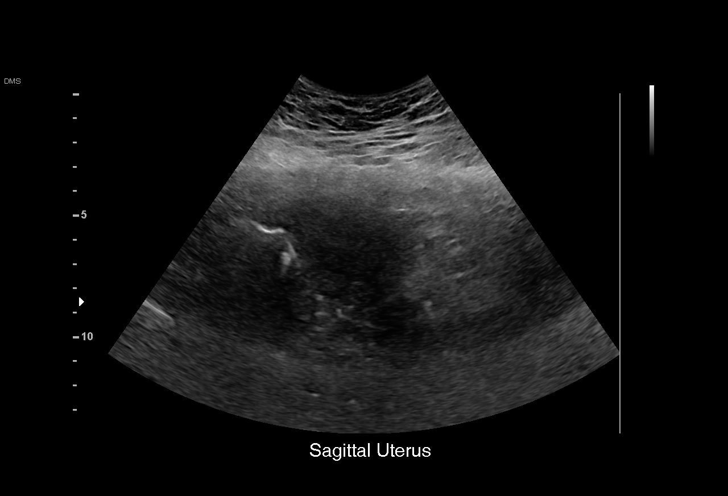
[im 10/27]
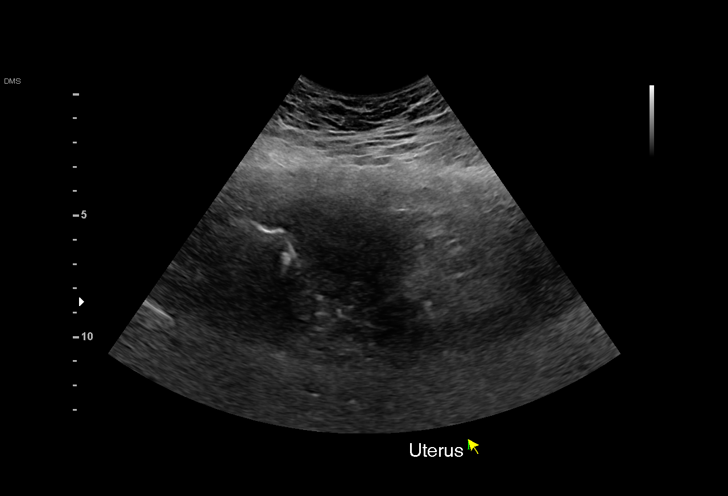
[im 12/27]
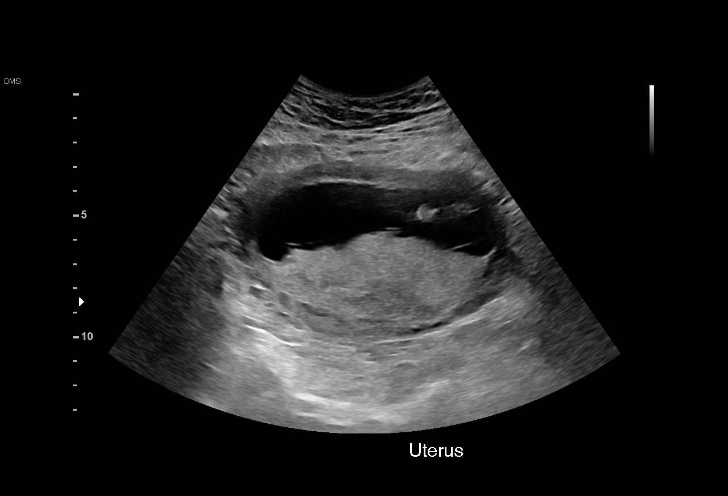
[im 14/27]
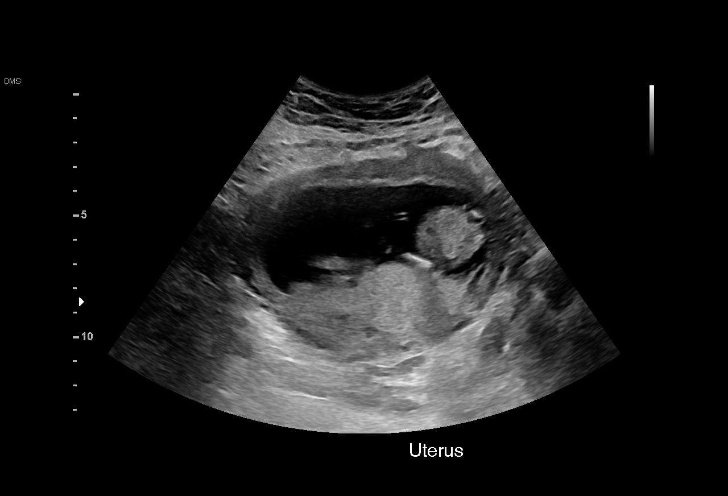
[im 16/27]
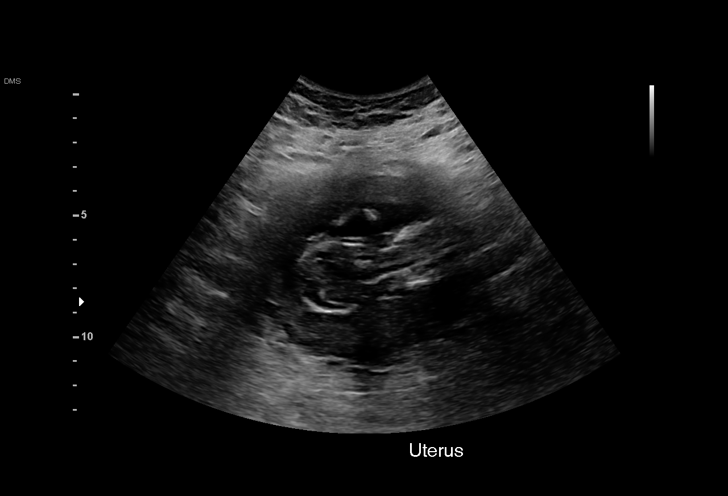
[im 18/27]
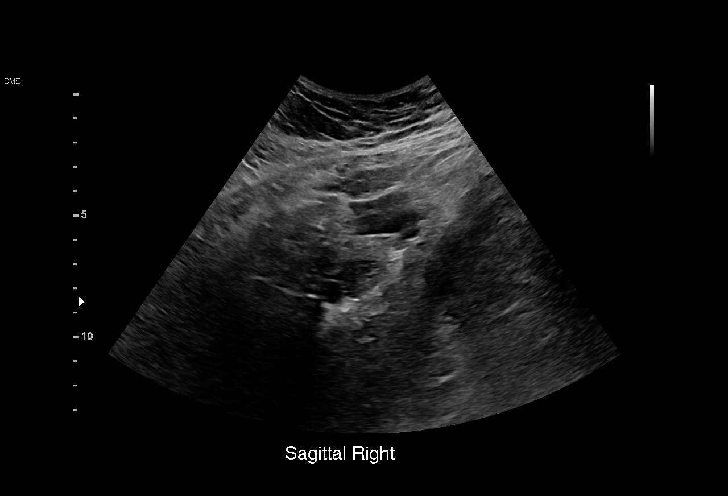
[im 19/27]
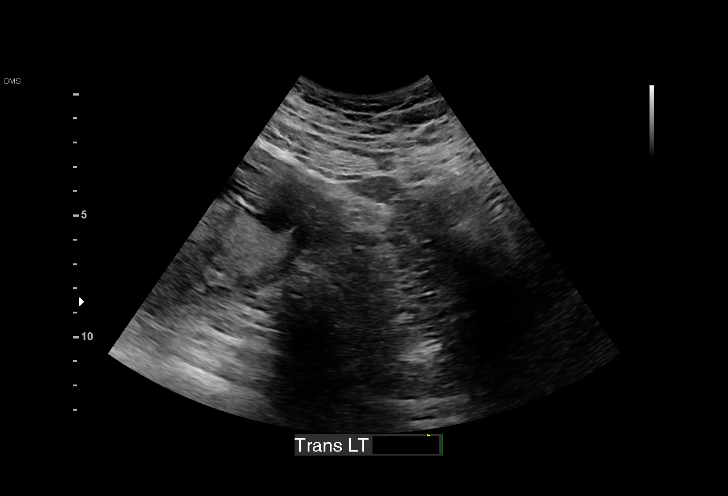
[im 21/27]
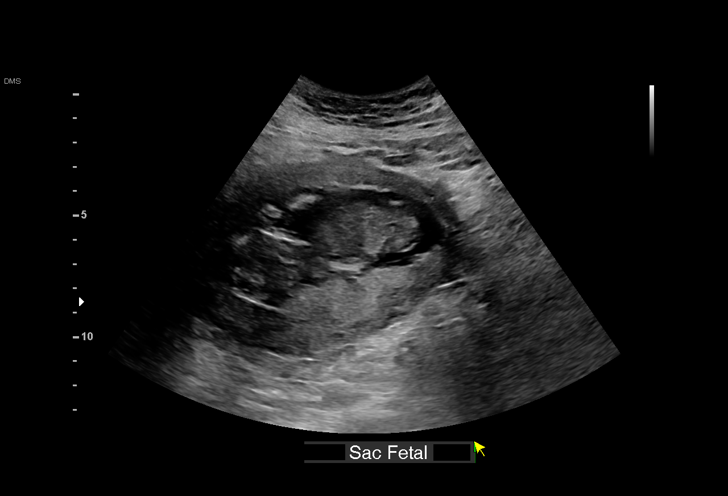
[im 23/27]
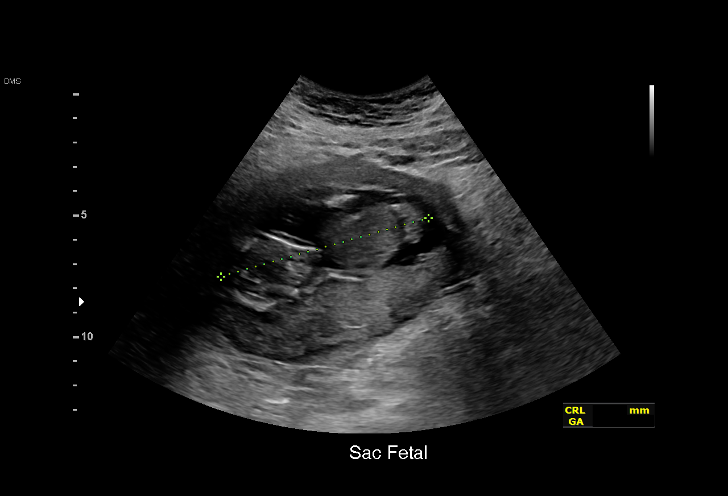
[im 25/27]
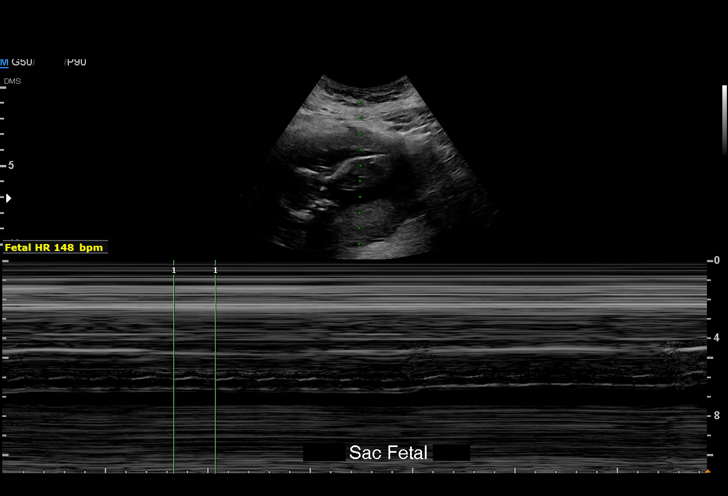
[im 27/27]
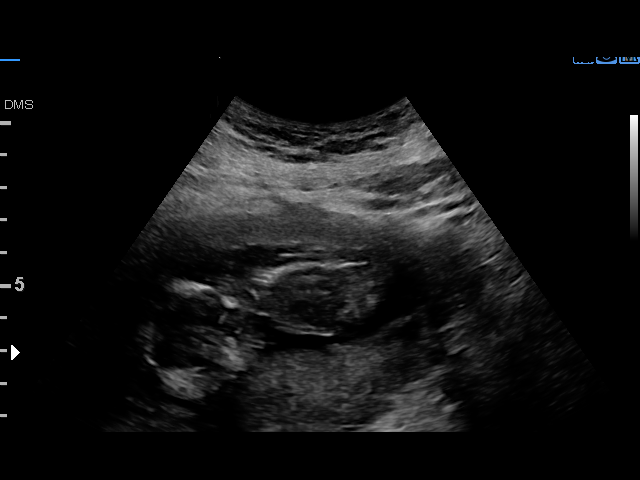

[15 of 27 positions shown; findings below may reference images not displayed]

FINDINGS: Intrauterine gestational sac: Single

Yolk sac:  Not Visualized.

Embryo:  Visualized.

Cardiac Activity: Visualized.

Heart Rate: 145 bpm

CRL:   87.56 mm   14 w 4 d                  US EDC: 05/25/2019

Subchorionic hemorrhage:  None

Maternal uterus/adnexae: The ovaries are not visualized.
IMPRESSION: 1. Single living intrauterine fetus measuring 14 weeks 4 days.
2. By today's exam EDC is 05/25/2019.

## 2019-08-15 ENCOUNTER — Telehealth: Payer: Self-pay

## 2019-08-15 NOTE — Telephone Encounter (Signed)
  Returned call to patient, left VM message to call and schedule appt for bleeding with IUD.

## 2019-08-20 ENCOUNTER — Ambulatory Visit: Payer: Medicare Other | Admitting: Advanced Practice Midwife

## 2019-08-26 ENCOUNTER — Ambulatory Visit (INDEPENDENT_AMBULATORY_CARE_PROVIDER_SITE_OTHER): Payer: Medicare Other | Admitting: Advanced Practice Midwife

## 2019-08-26 ENCOUNTER — Other Ambulatory Visit: Payer: Self-pay

## 2019-08-26 VITALS — BP 158/73 | HR 83 | Wt 332.0 lb

## 2019-08-26 DIAGNOSIS — Z975 Presence of (intrauterine) contraceptive device: Secondary | ICD-10-CM

## 2019-08-26 DIAGNOSIS — N921 Excessive and frequent menstruation with irregular cycle: Secondary | ICD-10-CM

## 2019-08-26 DIAGNOSIS — N939 Abnormal uterine and vaginal bleeding, unspecified: Secondary | ICD-10-CM | POA: Diagnosis not present

## 2019-08-26 MED ORDER — MEGESTROL ACETATE 20 MG PO TABS: 40.0000 mg | ORAL_TABLET | Freq: Two times a day (BID) | ORAL | 1 refills | Status: DC

## 2019-08-26 NOTE — Patient Instructions (Signed)
Abnormal Uterine Bleeding Abnormal uterine bleeding is unusual bleeding from the uterus. It includes:  Bleeding or spotting between periods.  Bleeding after sex.  Bleeding that is heavier than normal.  Periods that last longer than usual.  Bleeding after menopause. Abnormal uterine bleeding can affect women at various stages in life, including teenagers, women in their reproductive years, pregnant women, and women who have reached menopause. Common causes of abnormal uterine bleeding include:  Pregnancy.  Growths of tissue (polyps).  A noncancerous tumor in the uterus (fibroid).  Infection.  Cancer.  Hormonal imbalances. Any type of abnormal bleeding should be evaluated by a health care provider. Many cases are minor and simple to treat, while others are more serious. Treatment will depend on the cause of the bleeding. Follow these instructions at home:  Monitor your condition for any changes.  Do not use tampons, douche, or have sex if told by your health care provider.  Change your pads often.  Get regular exams that include pelvic exams and cervical cancer screening.  Keep all follow-up visits as told by your health care provider. This is important. Contact a health care provider if:  Your bleeding lasts for more than one week.  You feel dizzy at times.  You feel nauseous or you vomit. Get help right away if:  You pass out.  Your bleeding soaks through a pad every hour.  You have abdominal pain.  You have a fever.  You become sweaty or weak.  You pass large blood clots from your vagina. Summary  Abnormal uterine bleeding is unusual bleeding from the uterus.  Any type of abnormal bleeding should be evaluated by a health care provider. Many cases are minor and simple to treat, while others are more serious.  Treatment will depend on the cause of the bleeding. This information is not intended to replace advice given to you by your health care provider.  Make sure you discuss any questions you have with your health care provider. Document Released: 09/26/2005 Document Revised: 01/03/2018 Document Reviewed: 10/28/2016 Elsevier Patient Education  2020 Elsevier Inc.  

## 2019-08-26 NOTE — Progress Notes (Signed)
Pt is having light to moderate bleeding daily since placement of IUD. Pt is having cramping as well.

## 2019-08-26 NOTE — Progress Notes (Signed)
  GYNECOLOGY PROGRESS NOTE  History:  28 y.o. G3P3003 presents to [facility] office today for problem gyn visit. She reports having daily vaginal bleeding since placement of her [place] IUD after C/S for her daughter on 05/18/2019. She says she has to wear pads daily, goes through several pads per day. Reports sharp and crampy suprapubic pain with bleeding. Pain is below C/S incision site. Incision site healed well. No vaginal discharge. No lesions, pruritus, skin changes. Some dysuria recently, for which she presented to her PCP, who prescribed abx for a UTI (pt taking these currently - unsure name of medication). No fever, chills, recent illness or sick contacts, cp ,sob, nvd, constipation. She reports having a headache here at clinic.   Pt says her periods were regular prior to last pregnancy, occurring monthly, lasting ~7 days, with several days of very heavy bleeding requiring one pad per hour at [facility]. No personal history of bleeding d/o. Family history unknown as the patient was adopted.  The following portions of the patient's history were reviewed and updated as appropriate: allergies, current medications, past family history, past medical history, past social history, past surgical history and problem list. Last pap smear on 08/2018 was normal.  Review of Systems:  Pertinent items are noted in HPI.   Objective:  Physical Exam Blood pressure (!) 158/73, pulse 83, weight (!) 332 lb (150.6 kg), unknown if currently breastfeeding. VS reviewed, nursing note reviewed,  Constitutional: well developed, well nourished, no distress HEENT: normocephalic CV: normal rate Pulm/chest wall: normal effort Breast Exam: deferred Abdomen: soft, suprapubic ttp Neuro: alert and oriented x 3 Skin: warm, dry Psych: affect normal Pelvic exam: Cervix visualized, but no IUD string appreciated. Cervix normal in appearance, os visually closed, without lesion, normal scant white discharge, vaginal walls and external  genitalia normal  Assessment & Plan:  There are no diagnoses linked to this encounter.  Abnormal uterine bleeding and breakthrough bleeding with IUD Most likely items in DDx for the patient's persistent AUB and suprapubic pain include IUD malposition or migration or partial expulsion, and/or greater-than-expected abnormal bleeding s/p IUD placement. Other PALM-COEIN etiologies of patient's bleeding are possible but less likely given her presentation. Infection unlikely in the absence of any history or signs / symptoms concerning for this. Keep in mind for future pelvic exams: patient's cervix was difficult to visualize and required use of a long speculum.  - Megace x 1 month to help control bleeding - TVUS to evaluate for presence of IUD and proper placement - RTC 3 months to reevaluate   Aron Baba, Medical Student 3:34 PM

## 2019-08-27 ENCOUNTER — Telehealth: Payer: Self-pay

## 2019-08-27 MED ORDER — MEGESTROL ACETATE 40 MG PO TABS: 40.0000 mg | ORAL_TABLET | Freq: Two times a day (BID) | ORAL | 1 refills | Status: AC

## 2019-08-27 NOTE — Addendum Note (Signed)
Addended by: Fatima Blank A on: 08/27/2019 04:14 PM   Modules accepted: Orders

## 2019-08-27 NOTE — Telephone Encounter (Signed)
Can you please confirm directions for recent Rx for irregular bleeding  Pharmacy [facility])  called stating directions are 2 pills twice a day and only 60 tabs were sent.   Please advise

## 2019-09-09 ENCOUNTER — Ambulatory Visit (HOSPITAL_COMMUNITY): Admission: RE | Admit: 2019-09-09 | Payer: Medicare Other | Source: Ambulatory Visit

## 2019-09-19 ENCOUNTER — Telehealth: Payer: Medicare Other

## 2020-01-15 IMAGING — US US MFM FETAL BPP WO NON STRESS
1 series · 12 of 18 positions shown · non-contrast
Comparison: none

[Series 1: us mfm fetal bpp wo non stress · 18 acquisitions, 12 frames shown]
[im 1/18]
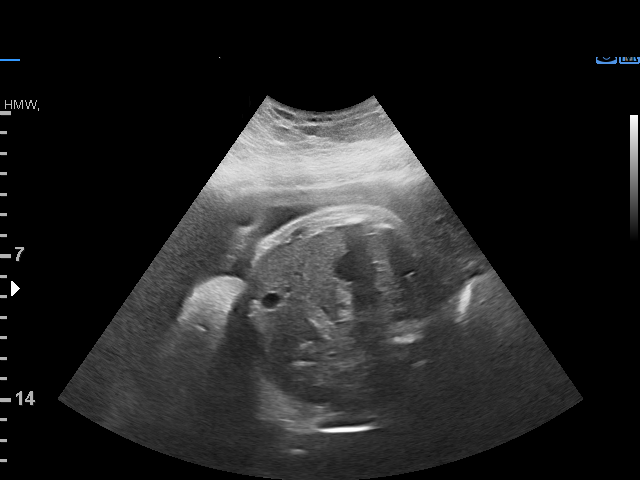
[im 3/18]
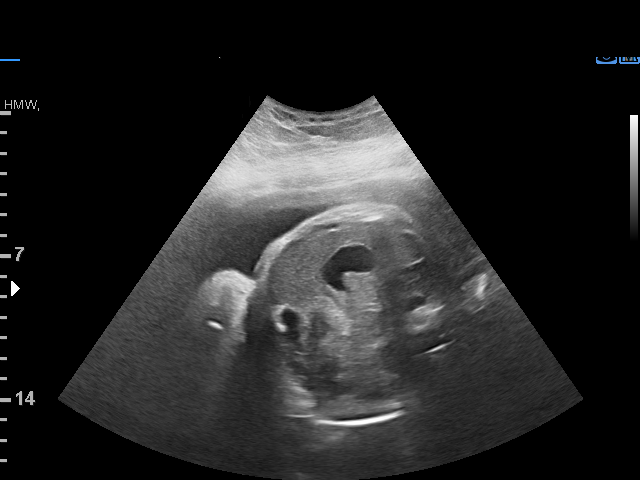
[im 4/18]
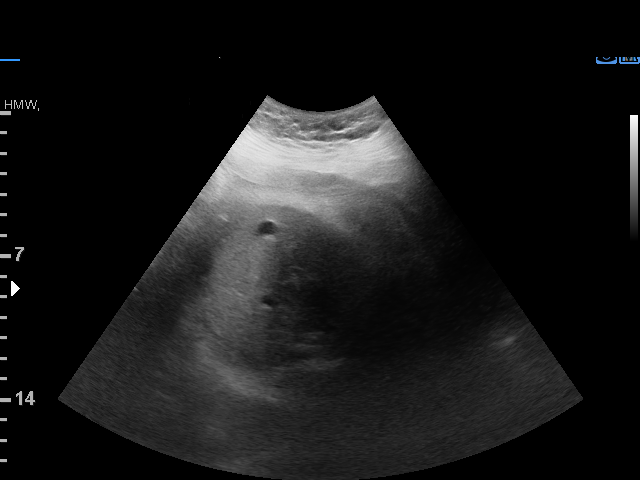
[im 6/18]
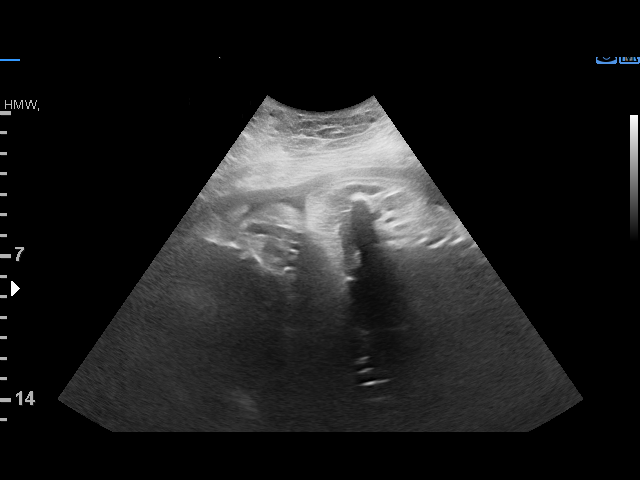
[im 7/18]
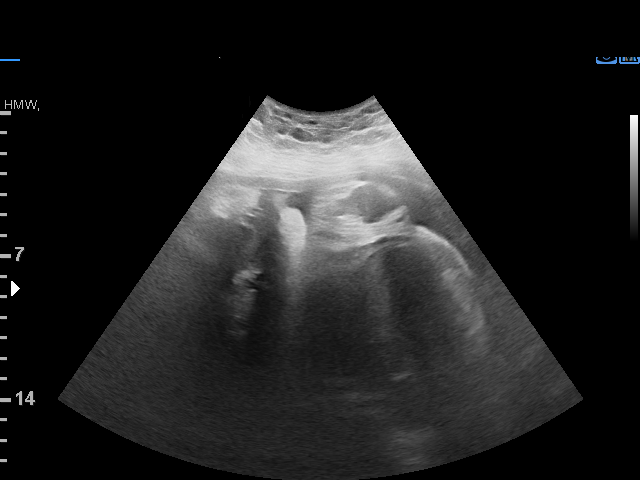
[im 9/18]
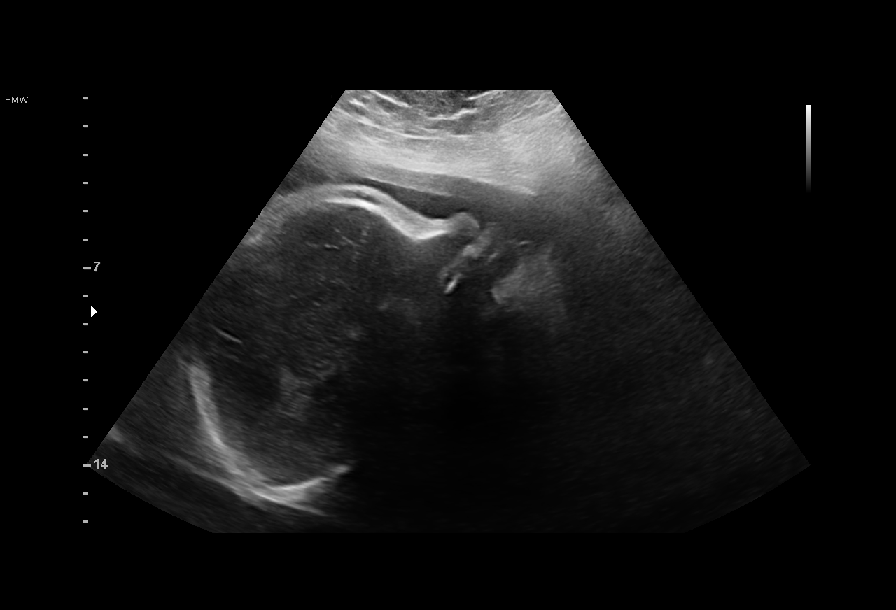
[im 10/18]
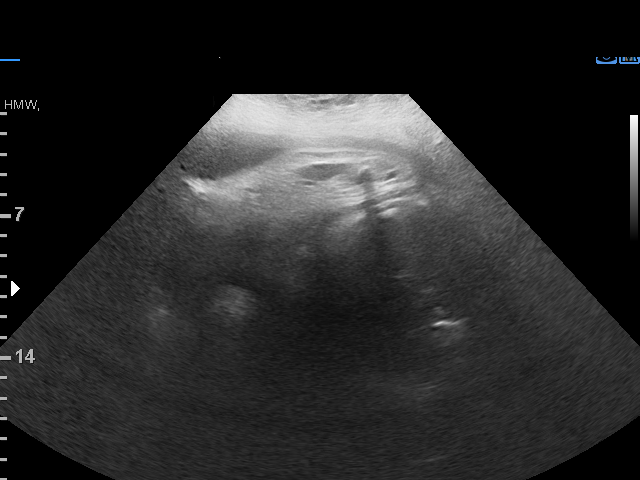
[im 12/18]
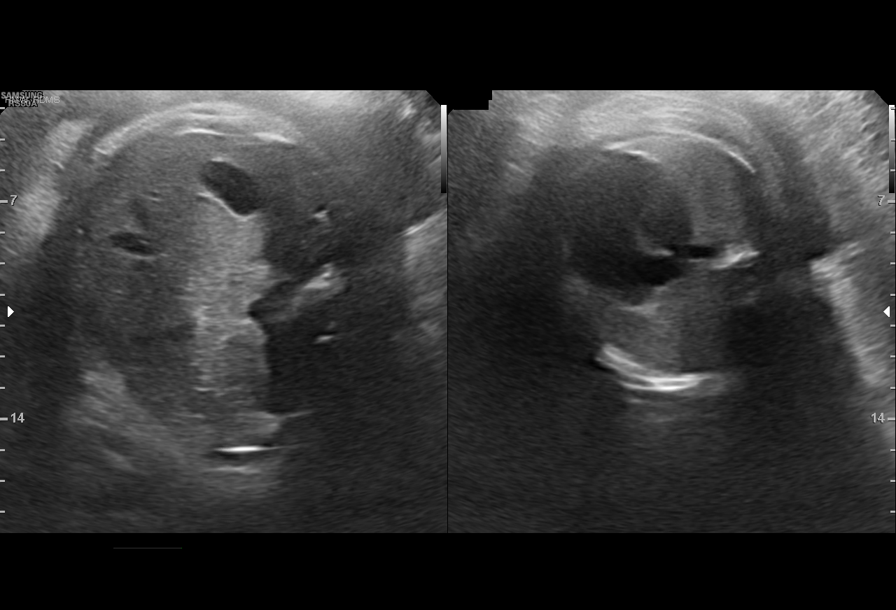
[im 13/18]
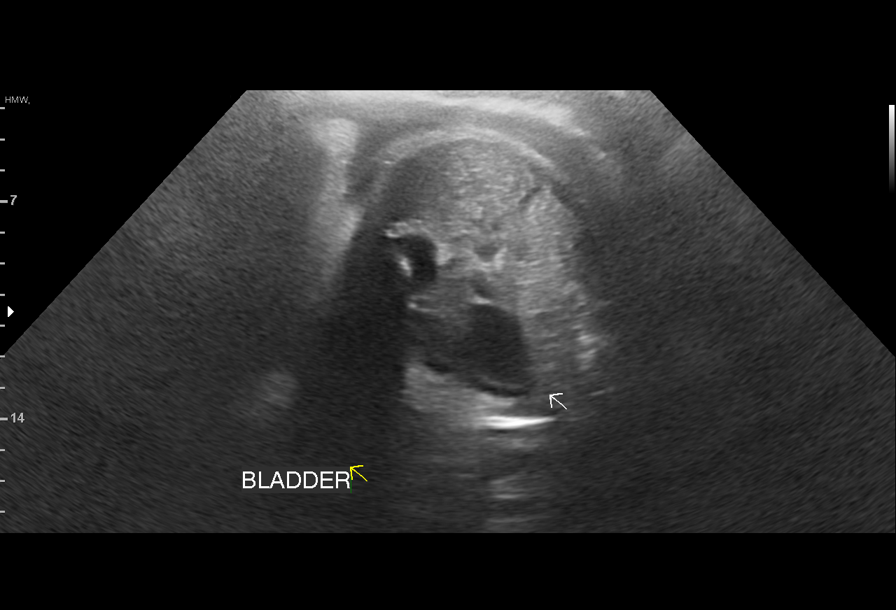
[im 15/18]
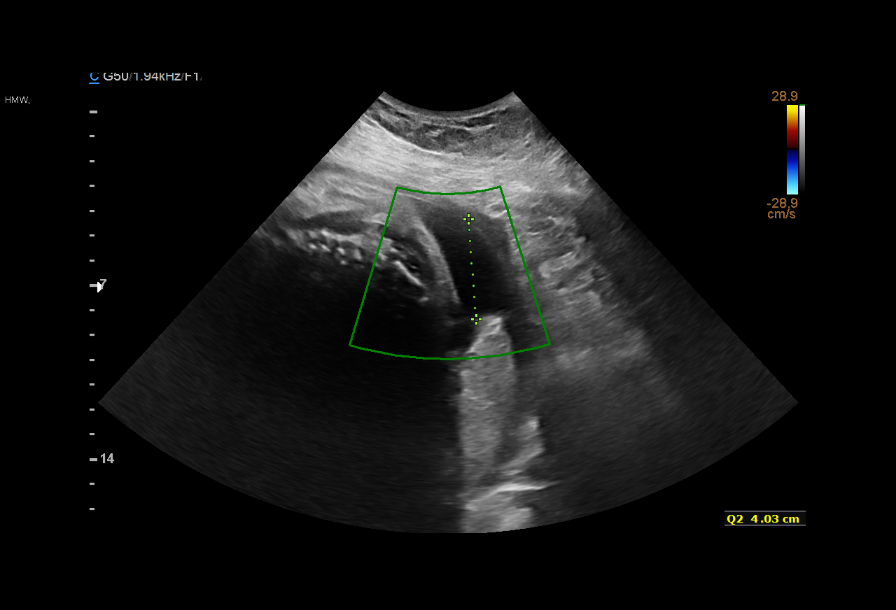
[im 16/18]
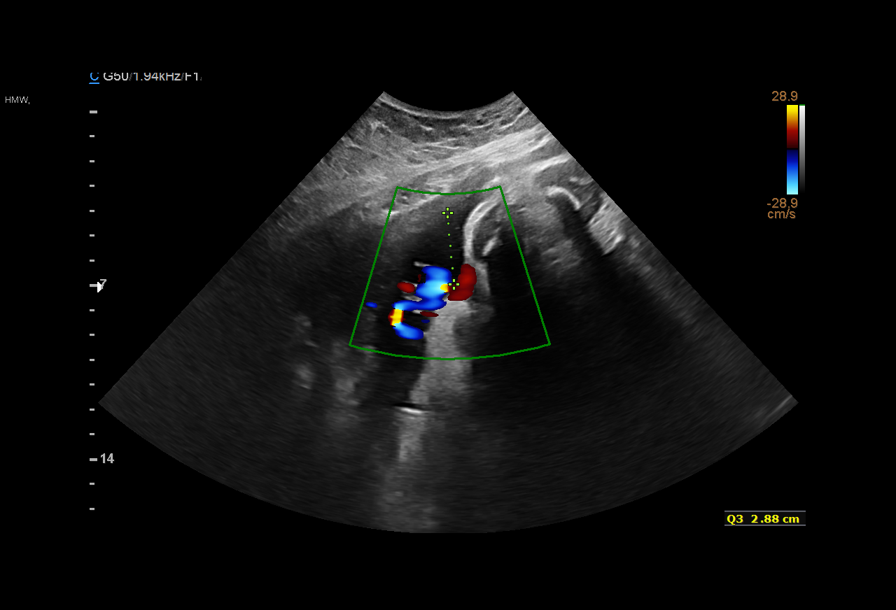
[im 18/18]
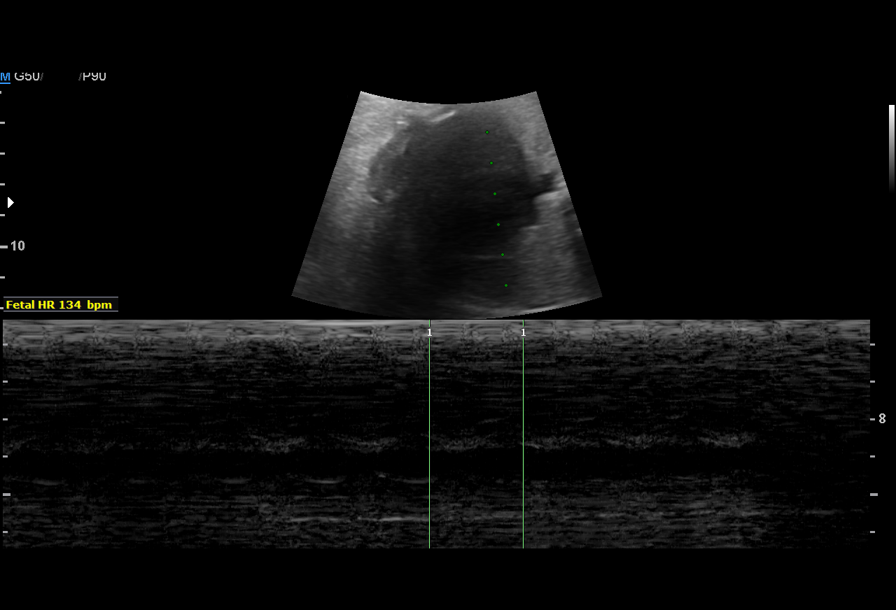

[12 of 18 positions shown; findings below may reference images not displayed]

[REDACTED]
                                                             [REDACTED]
                   [id number]

     STRESS                                            GALELA
 ----------------------------------------------------------------------

 ----------------------------------------------------------------------
Indications

  36 weeks gestation of pregnancy
  Maternal morbid obesity (BMI 55)
  Hypertension - Chronic/Pre-existing (ASA)
  Previous cesarean delivery, antepartum x 2
 ----------------------------------------------------------------------
Vital Signs

                                                Height:        5'4"
Fetal Evaluation

 Num Of Fetuses:          1
 Fetal Heart Rate(bpm):   134
 Cardiac Activity:        Observed
 Presentation:            Breech

 Amniotic Fluid
 AFI FV:      Within normal limits

 AFI Sum(cm)     %Tile       Largest Pocket(cm)
 12.31           40

 RUQ(cm)       RLQ(cm)       LUQ(cm)        LLQ(cm)

Biophysical Evaluation

 Amniotic F.V:   Within normal limits       F. Tone:         Observed
 F. Movement:    Observed                   Score:           [DATE]
 F. Breathing:   Observed
OB History

 Gravidity:    3         Term:   2        Prem:   0        SAB:   0
 TOP:          0       Ectopic:  0        Living: 2
Gestational Age

 LMP:           38w 2d        Date:  08/06/18                 EDD:   05/13/19
 Best:          36w 4d     Det. By:  Early Ultrasound         EDD:   05/25/19
                                     (11/28/18)
Anatomy

 Thoracic:              Appears normal         Bladder:                Appears normal
 Stomach:               Appears normal, left
                        sided
Cervix Uterus Adnexa

 Cervix
 Not visualized (advanced GA >00wks)
Impression

 Amniotic fluid is normal and good fetal activity is seen.
 Antenatal testing is reassuring. BPP [DATE].
Recommendations

 -Continue weekly BPP till delivery.
                 IDALI

## 2020-01-29 IMAGING — US US MFM OB FOLLOW UP
1 series · 14 of 22 positions shown · non-contrast
Comparison: none

[Series 1: us mfm ob follow up · 22 acquisitions, 14 frames shown]
[im 1/22]
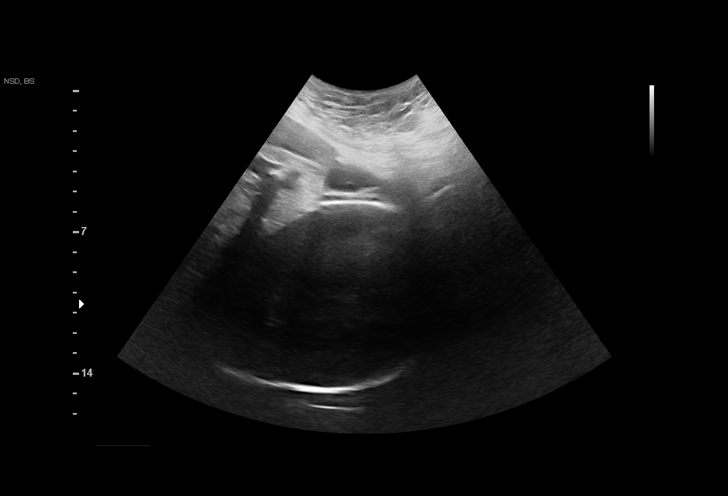
[im 3/22]
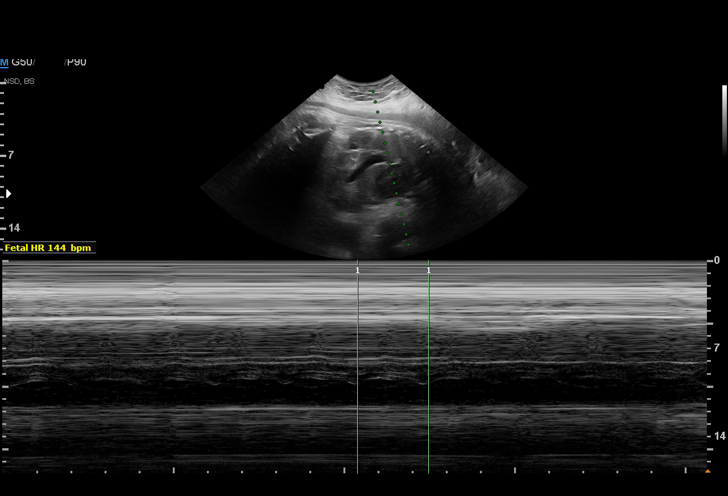
[im 4/22]
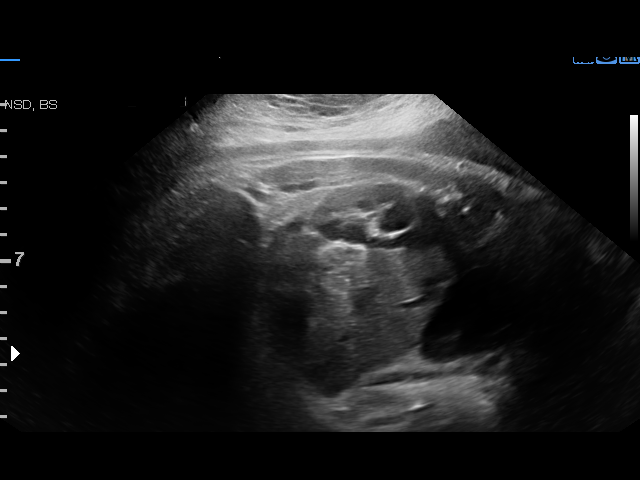
[im 6/22]
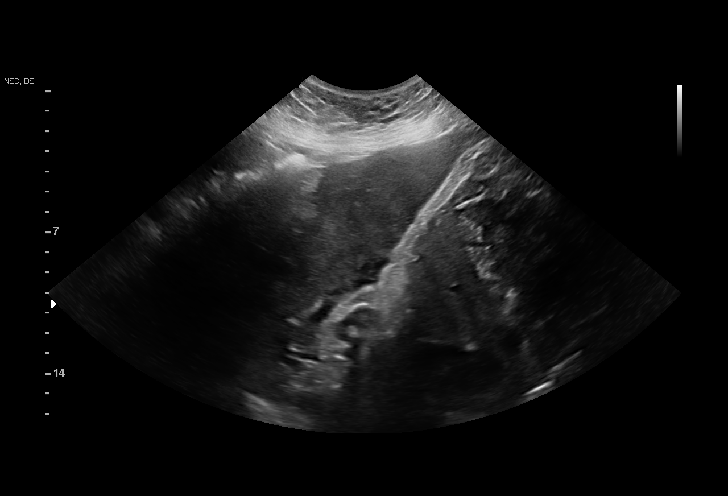
[im 8/22]
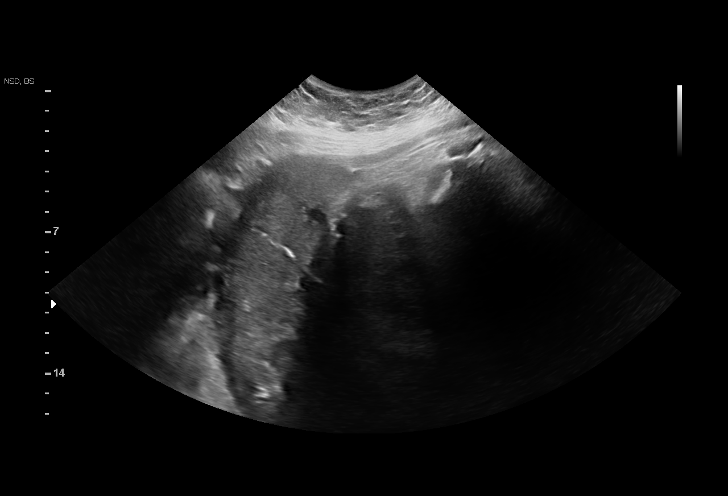
[im 9/22]
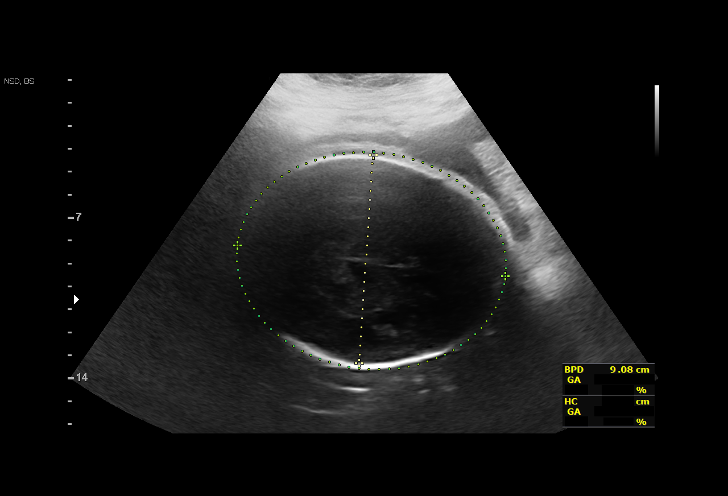
[im 11/22]
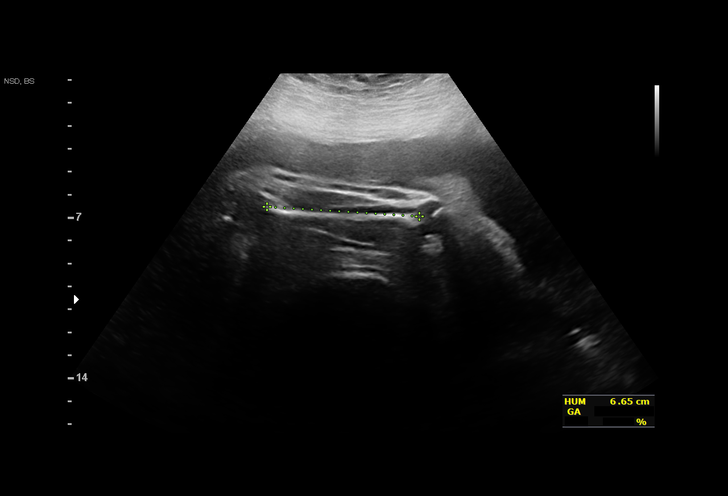
[im 12/22]
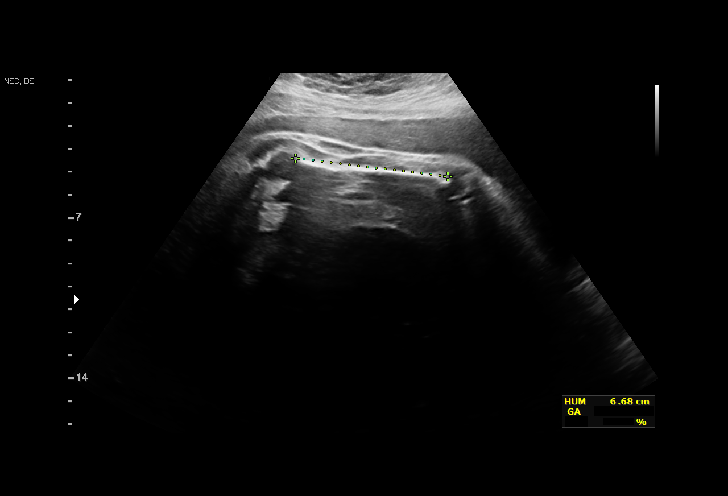
[im 14/22]
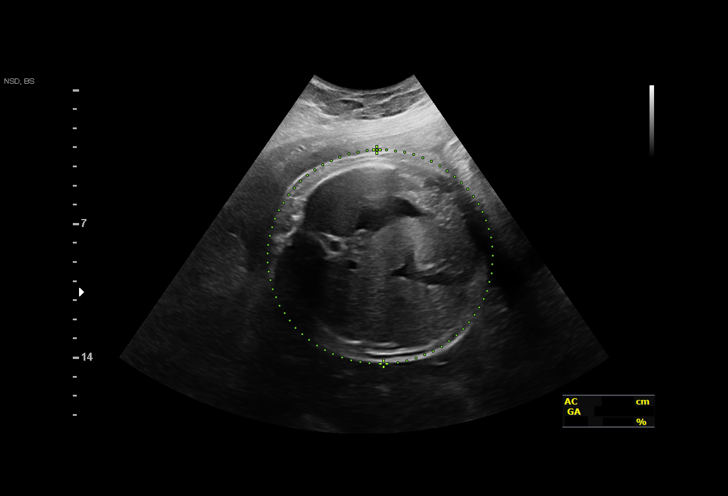
[im 15/22]
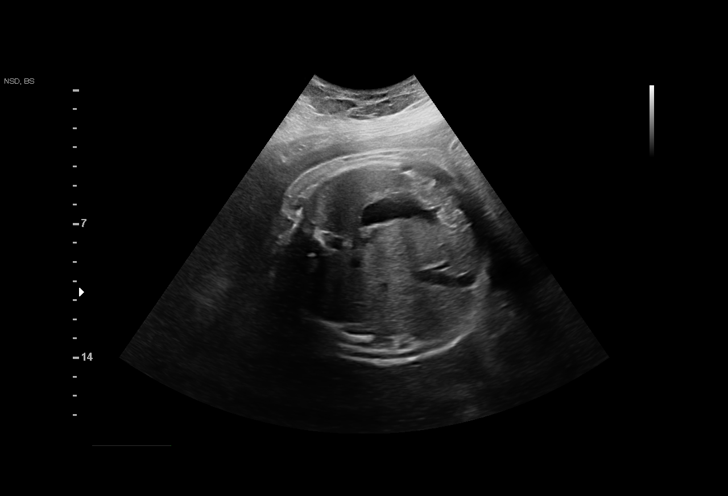
[im 17/22]
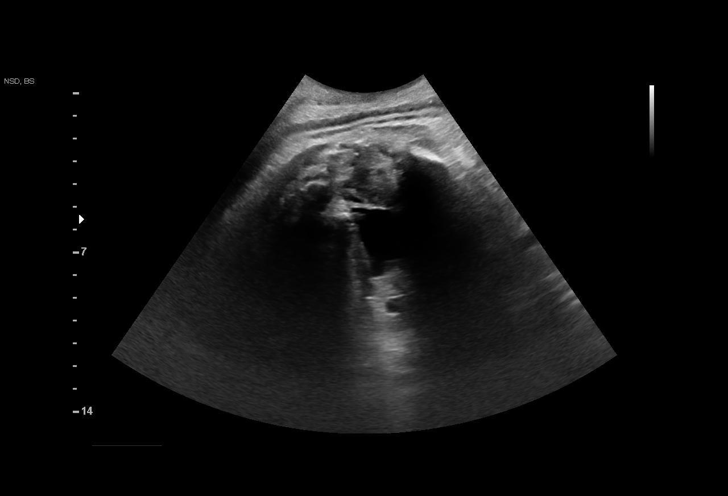
[im 19/22]
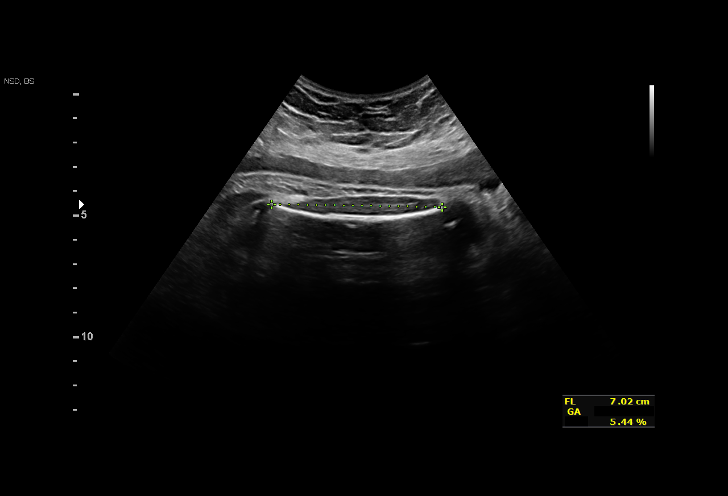
[im 20/22]
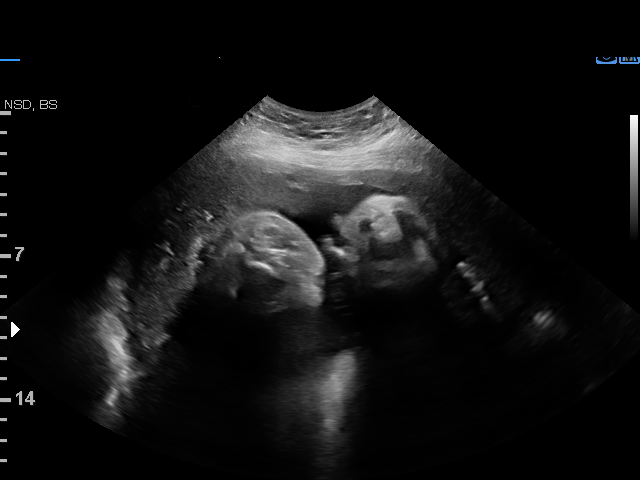
[im 22/22]
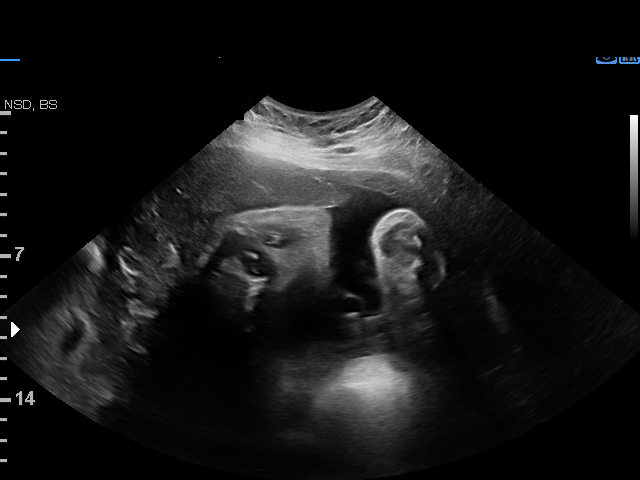

[14 of 22 positions shown; findings below may reference images not displayed]

[REDACTED]
                                                             [REDACTED]
                   [id number]

                                                       MANION
     STRESS                                            MANYOMA
 ----------------------------------------------------------------------

 ----------------------------------------------------------------------
Indications

  Maternal morbid obesity (BMI 55)
  38 weeks gestation of pregnancy
  Hypertension - Chronic/Pre-existing (ASA)
  Previous cesarean delivery, antepartum x 2
  Encounter for other antenatal screening
  follow-up (Low Risk NIPS)
 ----------------------------------------------------------------------
Vital Signs

                                                Height:        5'4"
Fetal Evaluation

 Num Of Fetuses:          1
 Fetal Heart Rate(bpm):   144
 Cardiac Activity:        Observed
 Presentation:            Cephalic
 Placenta:                Posterior
 Amniotic Fluid
 AFI FV:      Within normal limits

 AFI Sum(cm)     %Tile       Largest Pocket(cm)
 14.58           57

 RUQ(cm)       RLQ(cm)       LUQ(cm)        LLQ(cm)

Biophysical Evaluation

 Amniotic F.V:   Within normal limits       F. Tone:         Observed
 F. Movement:    Observed                   Score:           [DATE]
 F. Breathing:   Observed
Biometry

 BPD:      90.9  mm     G. Age:  36w 6d         32  %    CI:        72.67   %    70 - 86
                                                         FL/HC:       20.8  %    20.6 -
 HC:      339.1  mm     G. Age:  39w 0d         44  %    HC/AC:       0.94       0.87 -
 AC:      359.1  mm     G. Age:  39w 6d         93  %    FL/BPD:      77.8  %    71 - 87
 FL:       70.7  mm     G. Age:  36w 2d          8  %    FL/AC:       19.7  %    20 - 24
 HUM:      66.6  mm     G. Age:  38w 5d         78  %

 Est. FW:    7977   gm   7 lb 13 oz      67  %
OB History

 Gravidity:    3         Term:   2        Prem:   0        SAB:   0
 TOP:          0       Ectopic:  0        Living: 2
Gestational Age

 LMP:           40w 2d        Date:  08/06/18                 EDD:   05/13/19
 U/S Today:     38w 0d                                        EDD:   05/29/19
 Best:          38w 4d     Det. By:  Early Ultrasound         EDD:   05/25/19
                                     (11/28/18)
Anatomy

 Cranium:               Appears normal         Aortic Arch:            Previously seen
 Cavum:                 Previously seen        Ductal Arch:            Previously seen
 Ventricles:            Appears normal         Diaphragm:              Previously seen
 Choroid Plexus:        Previously seen        Stomach:                Appears normal, left
                                                                       sided
 Cerebellum:            Previously seen        Abdomen:                Appears normal
 Posterior Fossa:       Previously seen        Abdominal Wall:         Previously seen
 Nuchal Fold:           Previously seen        Cord Vessels:           Appears normal (3
                                                                       vessel cord)
 Face:                  Orbits and profile     Kidneys:                Appear normal
                        previously seen
 Lips:                  Previously seen        Bladder:                Appears normal
 Thoracic:              Appears normal         Spine:                  Previously seen
 Heart:                 Echogenic focus        Upper Extremities:      Previously seen
                        in LV prev seen
 RVOT:                  Previously seen        Lower Extremities:      Previously seen
 LVOT:                  Previously seen

 Other:  Female gender previously seen. Heels previously visualized.
         Technically difficult due to maternal habitus and fetal position.
Cervix Uterus Adnexa

 Cervix
 Not visualized (advanced GA >94wks)
Impression

 Normal interval growth.
 BPP [DATE]
 CHTN
Recommendations

 Delivery scheduled for [DATE]

## 2020-02-25 ENCOUNTER — Encounter: Payer: Self-pay | Admitting: Nurse Practitioner

## 2020-02-25 ENCOUNTER — Ambulatory Visit: Payer: Medicare Other | Attending: Nurse Practitioner | Admitting: Nurse Practitioner

## 2020-02-25 ENCOUNTER — Other Ambulatory Visit: Payer: Self-pay

## 2020-02-25 VITALS — Ht 64.0 in | Wt 310.0 lb

## 2020-02-25 DIAGNOSIS — Z13228 Encounter for screening for other metabolic disorders: Secondary | ICD-10-CM | POA: Diagnosis not present

## 2020-02-25 DIAGNOSIS — Z130 Encounter for screening for diseases of the blood and blood-forming organs and certain disorders involving the immune mechanism: Secondary | ICD-10-CM | POA: Diagnosis not present

## 2020-02-25 DIAGNOSIS — Z7689 Persons encountering health services in other specified circumstances: Secondary | ICD-10-CM | POA: Diagnosis not present

## 2020-02-25 DIAGNOSIS — Z13220 Encounter for screening for lipoid disorders: Secondary | ICD-10-CM

## 2020-02-25 DIAGNOSIS — Z131 Encounter for screening for diabetes mellitus: Secondary | ICD-10-CM

## 2020-02-25 NOTE — Progress Notes (Signed)
Virtual Visit via Telephone Note Due to national recommendations of social distancing due to [place] 19, telehealth visit is felt to be most appropriate for this patient at this time.  I discussed the limitations, risks, security and privacy concerns of performing an evaluation and management service by telephone and the availability of in person appointments. I also discussed with the patient that there may be a patient responsible charge related to this service. The patient expressed understanding and agreed to proceed.    I connected with Valerie Adkins on 02/25/20  at   1:50 PM EDT  EDT by telephone and verified that I am speaking with the correct person using two identifiers.   Consent I discussed the limitations, risks, security and privacy concerns of performing an evaluation and management service by telephone and the availability of in person appointments. I also discussed with the patient that there may be a patient responsible charge related to this service. The patient expressed understanding and agreed to proceed.   Location of Patient: Private Residence    Location of Provider: [place] and [employer] Office    Persons participating in Telemedicine visit: Valerie Rankins FNP-BC [name]    History of Present Illness: Telemedicine visit for: Establish Care  History of gestational HTN. She has been monitoring her blood pressure at home but not frequently. Blood pressure reading today 120/60. She is currently not taking any antihypertensives. Denies chest pain, shortness of breath, palpitations, lightheadedness, dizziness, headaches or BLE edema.  BP Readings from Last 3 Encounters:  08/26/19 (!) 158/73  06/24/19 128/78  05/29/19 (!) 137/95    Depression and Anxiety Currently taking effexor-XR 150 mg daily.  Associated symptoms: insomnia which she feels started when she began taking venlafaxine. She is currently being followed  by [employer]. She was instructed to follow up with her behavioral health specialist regarding her insomnia.   Past Medical History:  Diagnosis Date  . Hypertension   . Morbid obesity ([place])     Past Surgical History:  Procedure Laterality Date  . CESAREAN SECTION    . CESAREAN SECTION N/A 05/18/2019   Procedure: CESAREAN SECTION AND IUD INSERTION;  Surgeon: Donnamae Jude, MD;  Location: [facility];  Service: Obstetrics;  Laterality: N/A;  . CESAREAN SECTION    . CHOLECYSTECTOMY      Family History  Adopted: Yes  Problem Relation Age of Onset  . Healthy Mother   . Healthy Father     Social History   Socioeconomic History  . Marital status: Significant Other    Spouse name: Not on file  . Number of children: Not on file  . Years of education: Not on file  . Highest education level: Not on file  Occupational History  . Occupation: [employer]  Tobacco Use  . Smoking status: Never Smoker  . Smokeless tobacco: Never Used  Substance and Sexual Activity  . Alcohol use: Never  . Drug use: Never  . Sexual activity: Yes    Birth control/protection: I.U.D.  Other Topics Concern  . Not on file  Social History Narrative  . Not on file   Social Determinants of Health   Financial Resource Strain:   . Difficulty of Paying Living Expenses:   Food Insecurity:   . Worried About Charity fundraiser in the Last Year:   . Arboriculturist in the Last Year:   Transportation Needs:   . Film/video editor (Medical):   Marland Kitchen  Lack of Transportation (Non-Medical):   Physical Activity:   . Days of Exercise per Week:   . Minutes of Exercise per Session:   Stress:   . Feeling of Stress :   Social Connections:   . Frequency of Communication with Friends and Family:   . Frequency of Social Gatherings with Friends and Family:   . Attends Religious Services:   . Active Member of Clubs or Organizations:   . Attends Archivist Meetings:   Marland Kitchen Marital Status:       Observations/Objective: Awake, alert and oriented x 3   Review of Systems  Constitutional: Negative for fever, malaise/fatigue and weight loss.  HENT: Negative.  Negative for nosebleeds.   Eyes: Negative.  Negative for blurred vision, double vision and photophobia.  Respiratory: Negative.  Negative for cough and shortness of breath.   Cardiovascular: Negative.  Negative for chest pain, palpitations and leg swelling.  Gastrointestinal: Negative.  Negative for heartburn, nausea and vomiting.  Genitourinary:       Vaginal odor  Musculoskeletal: Negative.  Negative for myalgias.  Neurological: Negative.  Negative for dizziness, focal weakness, seizures and headaches.  Psychiatric/Behavioral: Positive for depression. Negative for suicidal ideas. The patient is nervous/anxious and has insomnia.     Assessment and Plan: [name] was seen today for establish care.  Diagnoses and all orders for this visit:  Encounter to establish care  Lipid screening -     Lipid panel; Future  Screening for deficiency anemia -     CBC; Future  Screening for metabolic disorder -     JIR67+ELFY; Future  Encounter for screening for diabetes mellitus -     Hemoglobin A1c; Future     Follow Up Instructions Return in about 6 weeks (around 04/07/2020).     I discussed the assessment and treatment plan with the patient. The patient was provided an opportunity to ask questions and all were answered. The patient agreed with the plan and demonstrated an understanding of the instructions.   The patient was advised to call back or seek an in-person evaluation if the symptoms worsen or if the condition fails to improve as anticipated.  I provided 16 minutes of non-face-to-face time during this encounter including median intraservice time, reviewing previous notes, labs, imaging, medications and explaining diagnosis and management.  Gildardo Pounds, FNP-BC

## 2020-02-28 ENCOUNTER — Other Ambulatory Visit: Payer: Self-pay

## 2020-02-28 ENCOUNTER — Other Ambulatory Visit (HOSPITAL_BASED_OUTPATIENT_CLINIC_OR_DEPARTMENT_OTHER): Payer: Medicare Other | Admitting: Nurse Practitioner

## 2020-02-28 ENCOUNTER — Ambulatory Visit: Payer: Medicare Other | Attending: Nurse Practitioner

## 2020-02-28 DIAGNOSIS — Z13220 Encounter for screening for lipoid disorders: Secondary | ICD-10-CM

## 2020-02-28 DIAGNOSIS — Z130 Encounter for screening for diseases of the blood and blood-forming organs and certain disorders involving the immune mechanism: Secondary | ICD-10-CM

## 2020-02-28 DIAGNOSIS — R399 Unspecified symptoms and signs involving the genitourinary system: Secondary | ICD-10-CM

## 2020-02-28 DIAGNOSIS — Z13228 Encounter for screening for other metabolic disorders: Secondary | ICD-10-CM

## 2020-02-28 DIAGNOSIS — Z131 Encounter for screening for diabetes mellitus: Secondary | ICD-10-CM

## 2020-02-28 LAB — POCT URINALYSIS DIP (CLINITEK)
Bilirubin, UA: NEGATIVE
Blood, UA: NEGATIVE
Glucose, UA: NEGATIVE mg/dL
Ketones, POC UA: NEGATIVE mg/dL
Leukocytes, UA: NEGATIVE
Nitrite, UA: NEGATIVE
Spec Grav, UA: >=1.03 — AB (ref 1.010–1.025)
Urobilinogen, UA: 1 E.U./dL
pH, UA: 6 (ref 5.0–8.0)

## 2020-02-28 NOTE — Progress Notes (Signed)
Notes dysuria and vaginal irritation with itching x1 week.

## 2020-02-29 LAB — CMP14+EGFR
ALT: 16 IU/L (ref 0–32)
AST: 16 IU/L (ref 0–40)
Albumin/Globulin Ratio: 1.2 (ref 1.2–2.2)
Albumin: 4.1 g/dL (ref 3.9–5.0)
Alkaline Phosphatase: 68 IU/L (ref 48–121)
BUN/Creatinine Ratio: 15 (ref 9–23)
BUN: 10 mg/dL (ref 6–20)
Bilirubin Total: <0.2 mg/dL (ref 0.0–1.2)
CO2: 25 mmol/L (ref 20–29)
Calcium: 9.4 mg/dL (ref 8.7–10.2)
Chloride: 104 mmol/L (ref 96–106)
Creatinine, Ser: 0.67 mg/dL (ref 0.57–1.00)
GFR calc Af Amer: 137 mL/min/{1.73_m2} (ref >59)
GFR calc non Af Amer: 119 mL/min/{1.73_m2} (ref >59)
Globulin, Total: 3.4 g/dL (ref 1.5–4.5)
Glucose: 85 mg/dL (ref 65–99)
Potassium: 4 mmol/L (ref 3.5–5.2)
Sodium: 141 mmol/L (ref 134–144)
Total Protein: 7.5 g/dL (ref 6.0–8.5)

## 2020-02-29 LAB — CBC
Hematocrit: 38.3 % (ref 34.0–46.6)
Hemoglobin: 12.1 g/dL (ref 11.1–15.9)
MCH: 24.8 pg — ABNORMAL LOW (ref 26.6–33.0)
MCHC: 31.6 g/dL (ref 31.5–35.7)
MCV: 79 fL (ref 79–97)
Platelets: 266 10*3/uL (ref 150–450)
RBC: 4.88 x10E6/uL (ref 3.77–5.28)
RDW: 14.7 % (ref 11.7–15.4)
WBC: 7.2 10*3/uL (ref 3.4–10.8)

## 2020-02-29 LAB — LIPID PANEL
Chol/HDL Ratio: 3.8 ratio (ref 0.0–4.4)
Cholesterol, Total: 137 mg/dL (ref 100–199)
HDL: 36 mg/dL — ABNORMAL LOW (ref >39)
LDL Chol Calc (NIH): 79 mg/dL (ref 0–99)
Triglycerides: 123 mg/dL (ref 0–149)
VLDL Cholesterol Cal: 22 mg/dL (ref 5–40)

## 2020-02-29 LAB — HEMOGLOBIN A1C
Est. average glucose Bld gHb Est-mCnc: 123 mg/dL
Hgb A1c MFr Bld: 5.9 % — ABNORMAL HIGH (ref 4.8–5.6)

## 2020-03-20 ENCOUNTER — Ambulatory Visit: Payer: Medicare Other | Admitting: Nurse Practitioner

## 2020-05-15 ENCOUNTER — Encounter: Payer: Self-pay | Admitting: Nurse Practitioner

## 2020-05-15 ENCOUNTER — Other Ambulatory Visit: Payer: Self-pay

## 2020-05-15 ENCOUNTER — Ambulatory Visit: Payer: Medicare Other | Attending: Nurse Practitioner | Admitting: Nurse Practitioner

## 2020-05-15 VITALS — BP 109/72 | HR 104 | Temp 97.7°F | Wt 369.8 lb

## 2020-05-15 DIAGNOSIS — R7303 Prediabetes: Secondary | ICD-10-CM | POA: Diagnosis not present

## 2020-05-15 DIAGNOSIS — Z6841 Body Mass Index (BMI) 40.0 and over, adult: Secondary | ICD-10-CM | POA: Diagnosis not present

## 2020-05-15 DIAGNOSIS — R32 Unspecified urinary incontinence: Secondary | ICD-10-CM

## 2020-05-15 LAB — POCT URINALYSIS DIP (CLINITEK)
Bilirubin, UA: NEGATIVE
Blood, UA: NEGATIVE
Glucose, UA: NEGATIVE mg/dL
Ketones, POC UA: NEGATIVE mg/dL
Leukocytes, UA: NEGATIVE
Nitrite, UA: NEGATIVE
POC PROTEIN,UA: 30 — AB
Spec Grav, UA: >=1.03 — AB (ref 1.010–1.025)
Urobilinogen, UA: 1 E.U./dL
pH, UA: 5.5 (ref 5.0–8.0)

## 2020-05-15 LAB — GLUCOSE, POCT (MANUAL RESULT ENTRY): POC Glucose: 66 mg/dl — AB (ref 70–99)

## 2020-05-15 MED ORDER — PHENTERMINE HCL 37.5 MG PO TABS: 37.5000 mg | ORAL_TABLET | Freq: Every day | ORAL | 0 refills | Status: DC

## 2020-05-15 NOTE — Progress Notes (Signed)
Assessment & Plan:  Azhane was seen today for weight loss and feet swelling.  Diagnoses and all orders for this visit:  Morbid obesity with BMI of 60.0-69.9, adult (HCC) -     Amb Referral to Bariatric Surgery -     phentermine (ADIPEX-P) 37.5 MG tablet; Take 1 tablet (37.5 mg total) by mouth daily before breakfast. Discussed diet and exercise for person with BMI >60. Instructed: You must burn more calories than you eat. Losing 5 percent of your body weight should be considered a success. In the longer term, losing more than 15 percent of your body weight and staying at this weight is an extremely good result. However, keep in mind that even losing 5 percent of your body weight leads to important health benefits, so try not to get discouraged if you're not able to lose more than this. Will recheck weight in 1 month.  Prediabetes -     Glucose (CBG)  Urinary incontinence, unspecified type -     POCT URINALYSIS DIP (CLINITEK) -     Urinalysis, Complete    Patient has been counseled on age-appropriate routine health concerns for screening and prevention. These are reviewed and up-to-date. Referrals have been placed accordingly. Immunizations are up-to-date or declined.    Subjective:   Chief Complaint  Patient presents with  . Weight Loss    Pt. would like PCP to discuss regarding weight loss.   . feet swelling    Pt. stated both of her feet are swollen w/ pain.    HPI Valerie Adkins 29 y.o. female presents to office today for follow up. Wants to discuss weight loss management. She has a history of gestational HTN, depression and anxiety.    Morbid Obesity BMI 63.48 Requesting weight loss medication. Due to her weight she has been experiencing low back pain, stress incontinence, B/L knee pain, intermittent BLE edema. She is not dietary or exercise adherent. Eating fried foods, fat back, sugary drinks. Does not do a lot of walking. Only drinking 1-2 cups of water per  day. Lowest weight she has ever been was about 220 lbs at the age of 58. She lost her mother 6 years ago. Weight has been increasing over the years. She would like to lose weight to feel better and hopes to be able to get a breast reduction once her weight has improved.  Blood pressure is controlled today however we were only able to check a radial blood pressure with a large arm cuff.  BP Readings from Last 3 Encounters:  05/15/20 109/72  08/26/19 (!) 158/73  06/24/19 128/78    Stress Incontinence  She has had symptoms for a few years which have seemed to worsen over the past 12 months. Symptoms include: involuntary loss of urine, leakage, blunted sense of urge to void at times. She is currently using protective bladder control pads.  Denies any involuntary loss of stool.   Prediabetes Currently controlled with diet.  Lab Results  Component Value Date   HGBA1C 5.9 (H) 02/28/2020   Review of Systems  Constitutional: Negative for fever, malaise/fatigue and weight loss.  HENT: Negative.  Negative for nosebleeds.   Eyes: Negative.  Negative for blurred vision, double vision and photophobia.  Respiratory: Negative.  Negative for cough, shortness of breath and wheezing.   Cardiovascular: Positive for leg swelling. Negative for chest pain and palpitations.  Gastrointestinal: Negative.  Negative for heartburn, nausea and vomiting.  Musculoskeletal: Positive for back pain and joint pain. Negative for  myalgias.  Neurological: Negative.  Negative for dizziness, focal weakness, seizures and headaches.  Psychiatric/Behavioral: Negative.  Negative for suicidal ideas.    Past Medical History:  Diagnosis Date  . Hypertension   . Morbid obesity (HCC)     Past Surgical History:  Procedure Laterality Date  . CESAREAN SECTION    . CESAREAN SECTION N/A 05/18/2019   Procedure: CESAREAN SECTION AND IUD INSERTION;  Surgeon: Reva Bores, MD;  [facility];  Service: Obstetrics;  Laterality:  N/A;  . CESAREAN SECTION    . CHOLECYSTECTOMY      Family History  Adopted: Yes  Problem Relation Age of Onset  . Healthy Mother   . Healthy Father     Social History Reviewed with no changes to be made today.   Outpatient Medications Prior to Visit  Medication Sig Dispense Refill  . FLUoxetine (PROZAC) 20 MG capsule Take 20 mg by mouth daily.    Marland Kitchen acetaminophen (TYLENOL) 325 MG tablet Take 650 mg by mouth every 6 (six) hours as needed for moderate pain or headache. (Patient not taking: Reported on 05/15/2020)    . amLODipine (NORVASC) 5 MG tablet Take 1 tablet (5 mg total) by mouth daily. (Patient not taking: Reported on 08/26/2019) 60 tablet 0  . hydrALAZINE (APRESOLINE) 25 MG tablet Take 25 mg by mouth in the morning and at bedtime. (Patient not taking: Reported on 05/15/2020)    . venlafaxine XR (EFFEXOR-XR) 150 MG 24 hr capsule Take 150 mg by mouth daily.  (Patient not taking: Reported on 05/15/2020)     No facility-administered medications prior to visit.    Allergies  Allergen Reactions  . Bee Venom Swelling       Objective:    BP 109/72 (BP Location: Right Wrist, Patient Position: Sitting, Cuff Size: Normal)   Pulse (!) 104   Temp 97.7 F (36.5 C) (Temporal)   Wt (!) 369 lb 12.8 oz (167.7 kg)   SpO2 97%   BMI 63.48 kg/m  Wt Readings from Last 3 Encounters:  05/15/20 (!) 369 lb 12.8 oz (167.7 kg)  02/25/20 (!) 310 lb (140.6 kg)  08/26/19 (!) 332 lb (150.6 kg)    Physical Exam Vitals and nursing note reviewed.  Constitutional:      Appearance: She is well-developed.  HENT:     Head: Normocephalic and atraumatic.  Cardiovascular:     Rate and Rhythm: Normal rate and regular rhythm.     Heart sounds: Normal heart sounds. No murmur heard.  No friction rub. No gallop.   Pulmonary:     Effort: Pulmonary effort is normal. No tachypnea or respiratory distress.     Breath sounds: Normal breath sounds. No decreased breath sounds, wheezing, rhonchi or rales.  Chest:       Chest wall: No tenderness.  Abdominal:     General: Bowel sounds are normal.     Palpations: Abdomen is soft.  Musculoskeletal:        General: Normal range of motion.     Cervical back: Normal range of motion.     Right lower leg: Edema (mild swelling; non pitting) present.     Left lower leg: Edema (mild swelling; non pitting) present.  Skin:    General: Skin is warm and dry.  Neurological:     Mental Status: She is alert and oriented to person, place, and time.     Coordination: Coordination normal.  Psychiatric:        Behavior: Behavior normal.  Behavior is cooperative.        Thought Content: Thought content normal.        Judgment: Judgment normal.        Patient has been counseled extensively about nutrition and exercise as well as the importance of adherence with medications and regular follow-up. The patient was given clear instructions to go to ER or return to medical center if symptoms don't improve, worsen or new problems develop. The patient verbalized understanding.   Follow-up: Return in about 4 weeks (around 06/12/2020) for weight check.   Claiborne Rigg, FNP-BC [facility] [place], [place] [phone]   05/15/2020, 10:54 PM

## 2020-05-16 LAB — URINALYSIS, COMPLETE
Bilirubin, UA: NEGATIVE
Glucose, UA: NEGATIVE
Ketones, UA: NEGATIVE
Leukocytes,UA: NEGATIVE
Nitrite, UA: NEGATIVE
RBC, UA: NEGATIVE
Specific Gravity, UA: 1.029 (ref 1.005–1.030)
Urobilinogen, Ur: 1 mg/dL (ref 0.2–1.0)
pH, UA: 5.5 (ref 5.0–7.5)

## 2020-05-16 LAB — MICROSCOPIC EXAMINATION
Casts: NONE SEEN /lpf
Epithelial Cells (non renal): >10 /hpf — AB (ref 0–10)

## 2020-05-17 ENCOUNTER — Other Ambulatory Visit: Payer: Self-pay | Admitting: Nurse Practitioner

## 2020-05-17 DIAGNOSIS — N393 Stress incontinence (female) (male): Secondary | ICD-10-CM

## 2020-09-15 ENCOUNTER — Ambulatory Visit: Payer: Medicare Other | Admitting: Nurse Practitioner

## 2020-12-14 ENCOUNTER — Other Ambulatory Visit: Payer: Self-pay

## 2020-12-14 ENCOUNTER — Ambulatory Visit: Payer: Medicare Other | Attending: Nurse Practitioner | Admitting: Nurse Practitioner

## 2020-12-14 ENCOUNTER — Encounter: Payer: Self-pay | Admitting: Nurse Practitioner

## 2020-12-14 VITALS — BP 96/65 | HR 92 | Resp 16 | Ht 64.0 in | Wt 365.6 lb

## 2020-12-14 DIAGNOSIS — Z0000 Encounter for general adult medical examination without abnormal findings: Secondary | ICD-10-CM | POA: Diagnosis not present

## 2020-12-14 DIAGNOSIS — D649 Anemia, unspecified: Secondary | ICD-10-CM

## 2020-12-14 DIAGNOSIS — Z1159 Encounter for screening for other viral diseases: Secondary | ICD-10-CM | POA: Diagnosis not present

## 2020-12-14 DIAGNOSIS — Z6841 Body Mass Index (BMI) 40.0 and over, adult: Secondary | ICD-10-CM

## 2020-12-14 DIAGNOSIS — R7303 Prediabetes: Secondary | ICD-10-CM | POA: Diagnosis not present

## 2020-12-14 MED ORDER — SAXENDA 18 MG/3ML ~~LOC~~ SOPN: PEN_INJECTOR | SUBCUTANEOUS | 3 refills | Status: DC

## 2020-12-14 NOTE — Progress Notes (Signed)
Assessment & Plan:  Diagnoses and all orders for this visit:  Encounter for annual physical exam  Prediabetes -     Hemoglobin A1c -     CMP14+EGFR  Need for hepatitis C screening test -     HCV Ab w Reflex to Quant PCR  Anemia, unspecified type -     CBC  Morbid obesity with BMI of 60.0-69.9, adult (HCC) -     Thyroid Panel With TSH -     Liraglutide -Weight Management (SAXENDA) 18 MG/3ML SOPN; Inject 0.6 mg once daily for 1 week; increase by 0.6 mg daily each week for a target dose of 3 mg once daily if able to tolerate. Discussed diet and exercise for person with BMI >62. Instructed: You must burn more calories than you eat. Losing 5 percent of your body weight should be considered a success. In the longer term, losing more than 15 percent of your body weight and staying at this weight is an extremely good result. However, keep in mind that even losing 5 percent of your body weight leads to important health benefits, so try not to get discouraged if you're not able to lose more than this. Will recheck weight in 6 weeks  Patient has been counseled on age-appropriate routine health concerns for screening and prevention. These are reviewed and up-to-date. Referrals have been placed accordingly. Immunizations are up-to-date or declined.    Subjective:  No chief complaint on file.  HPI Valerie Adkins 30 y.o. female presents to office today for physical. She has a past medical history of Hypertension and Morbid obesity ([place]).   She is seeing a psychiatrist from [place] for her anxiety and depression. States the anxiety medication makes her sleepy however she can not recall the name of the anxiety medication.    Morbid Obesity She has tried and failed phentermine. Would like to start an injectable for weight loss at this time. BMI 62  Prediabetes Well controlled without any oral diabetic medications at this time.  Lab Results  Component Value Date   HGBA1C 6.1 (H)  12/14/2020   Review of Systems  Constitutional: Negative for fever, malaise/fatigue and weight loss.  HENT: Negative.  Negative for nosebleeds.   Eyes: Negative.  Negative for blurred vision, double vision and photophobia.  Respiratory: Negative.  Negative for cough and shortness of breath.   Cardiovascular: Negative.  Negative for chest pain, palpitations and leg swelling.  Gastrointestinal: Negative.  Negative for heartburn, nausea and vomiting.  Musculoskeletal: Negative.  Negative for myalgias.  Neurological: Negative.  Negative for dizziness, focal weakness, seizures and headaches.  Psychiatric/Behavioral: Negative.  Negative for suicidal ideas.    Past Medical History:  Diagnosis Date  . Hypertension   . Morbid obesity ([place])     Past Surgical History:  Procedure Laterality Date  . CESAREAN SECTION    . CESAREAN SECTION N/A 05/18/2019   Procedure: CESAREAN SECTION AND IUD INSERTION;  Surgeon: Donnamae Jude, MD;  Location: [facility];  Service: Obstetrics;  Laterality: N/A;  . CESAREAN SECTION    . CHOLECYSTECTOMY     Family History  Adopted: Yes  Problem Relation Age of Onset  . Healthy Mother   . Healthy Father     Social History Reviewed with no changes to be made today.   Outpatient Medications Prior to Visit  Medication Sig Dispense Refill  . FLUoxetine (PROZAC) 20 MG capsule Take 20 mg by mouth daily.    . phentermine (ADIPEX-P) 37.5 MG  tablet Take 1 tablet (37.5 mg total) by mouth daily before breakfast. 30 tablet 0   No facility-administered medications prior to visit.    Allergies  Allergen Reactions  . Bee Venom Swelling       Objective:    BP 96/65   Pulse 92   Resp 16   Ht $R'5\' 4"'Pm$  (1.626 m)   Wt (!) 365 lb 9.6 oz (165.8 kg)   SpO2 97%   BMI 62.76 kg/m  Wt Readings from Last 3 Encounters:  12/14/20 (!) 365 lb 9.6 oz (165.8 kg)  05/15/20 (!) 369 lb 12.8 oz (167.7 kg)  02/25/20 (!) 310 lb (140.6 kg)    Physical Exam Vitals and nursing note  reviewed.  Constitutional:      Appearance: She is well-developed. She is morbidly obese.  HENT:     Head: Normocephalic and atraumatic.  Cardiovascular:     Rate and Rhythm: Normal rate and regular rhythm.     Heart sounds: Normal heart sounds. No murmur heard. No friction rub. No gallop.   Pulmonary:     Effort: Pulmonary effort is normal. No tachypnea or respiratory distress.     Breath sounds: Normal breath sounds. No decreased breath sounds, wheezing, rhonchi or rales.  Chest:     Chest wall: No tenderness.  Abdominal:     General: Bowel sounds are normal.     Palpations: Abdomen is soft.  Musculoskeletal:        General: Normal range of motion.     Cervical back: Normal range of motion.  Skin:    General: Skin is warm and dry.  Neurological:     Mental Status: She is alert and oriented to person, place, and time.     Coordination: Coordination normal.  Psychiatric:        Behavior: Behavior normal. Behavior is cooperative.        Thought Content: Thought content normal.        Judgment: Judgment normal.          Patient has been counseled extensively about nutrition and exercise as well as the importance of adherence with medications and regular follow-up. The patient was given clear instructions to go to ER or return to medical center if symptoms don't improve, worsen or new problems develop. The patient verbalized understanding.   Follow-up: Return for If able to take saxenda will need to see back in 4-6 weeks for weight check and repeat labs. Gildardo Pounds, FNP-BC [facility] [place], [place]   12/20/2020, 2:18 PM

## 2020-12-15 LAB — CMP14+EGFR
ALT: 18 IU/L (ref 0–32)
AST: 18 IU/L (ref 0–40)
Albumin/Globulin Ratio: 1.2 (ref 1.2–2.2)
Albumin: 4.3 g/dL (ref 3.9–5.0)
Alkaline Phosphatase: 67 IU/L (ref 44–121)
BUN/Creatinine Ratio: 11 (ref 9–23)
BUN: 9 mg/dL (ref 6–20)
Bilirubin Total: 0.2 mg/dL (ref 0.0–1.2)
CO2: 25 mmol/L (ref 20–29)
Calcium: 9.3 mg/dL (ref 8.7–10.2)
Chloride: 102 mmol/L (ref 96–106)
Creatinine, Ser: 0.8 mg/dL (ref 0.57–1.00)
Globulin, Total: 3.5 g/dL (ref 1.5–4.5)
Glucose: 82 mg/dL (ref 65–99)
Potassium: 4.5 mmol/L (ref 3.5–5.2)
Sodium: 141 mmol/L (ref 134–144)
Total Protein: 7.8 g/dL (ref 6.0–8.5)
eGFR: 102 mL/min/{1.73_m2} (ref >59)

## 2020-12-15 LAB — HEMOGLOBIN A1C
Est. average glucose Bld gHb Est-mCnc: 128 mg/dL
Hgb A1c MFr Bld: 6.1 % — ABNORMAL HIGH (ref 4.8–5.6)

## 2020-12-15 LAB — CBC
Hematocrit: 39.7 % (ref 34.0–46.6)
Hemoglobin: 12.7 g/dL (ref 11.1–15.9)
MCH: 24.7 pg — ABNORMAL LOW (ref 26.6–33.0)
MCHC: 32 g/dL (ref 31.5–35.7)
MCV: 77 fL — ABNORMAL LOW (ref 79–97)
Platelets: 302 10*3/uL (ref 150–450)
RBC: 5.15 x10E6/uL (ref 3.77–5.28)
RDW: 15.4 % (ref 11.7–15.4)
WBC: 6 10*3/uL (ref 3.4–10.8)

## 2020-12-15 LAB — HCV AB W REFLEX TO QUANT PCR: HCV Ab: <0.1 s/co ratio (ref 0.0–0.9)

## 2020-12-15 LAB — HCV INTERPRETATION

## 2020-12-15 LAB — THYROID PANEL WITH TSH
Free Thyroxine Index: 2.2 (ref 1.2–4.9)
T3 Uptake Ratio: 30 % (ref 24–39)
T4, Total: 7.4 ug/dL (ref 4.5–12.0)
TSH: 1.2 u[IU]/mL (ref 0.450–4.500)

## 2020-12-19 NOTE — Progress Notes (Incomplete)
   Assessment & Plan:  Diagnoses and all orders for this visit:  Encounter for annual physical exam  Prediabetes -     Hemoglobin A1c -     CMP14+EGFR  Need for hepatitis C screening test -     HCV Ab w Reflex to Quant PCR  Anemia, unspecified type -     CBC  Morbid obesity with BMI of 60.0-69.9, adult (HCC) -     Thyroid Panel With TSH -     Liraglutide -Weight Management (SAXENDA) 18 MG/3ML SOPN; Inject 0.6 mg once daily for 1 week; increase by 0.6 mg daily each week for a target dose of 3 mg once daily if able to tolerate.  Other orders -     Interpretation:    Patient has been counseled on age-appropriate routine health concerns for screening and prevention. These are reviewed and up-to-date. Referrals have been placed accordingly. Immunizations are up-to-date or declined.    Subjective:  No chief complaint on file.  HPI Valerie Adkins 30 y.o. female presents to office today   She is seeing a psychiatrist from [place] for her anxiety and depression. States the anxiety medication makes her sleepy however   ROS  Past Medical History:  Diagnosis Date  . Hypertension   . Morbid obesity ([place])     Past Surgical History:  Procedure Laterality Date  . CESAREAN SECTION    . CESAREAN SECTION N/A 05/18/2019   Procedure: CESAREAN SECTION AND IUD INSERTION;  Surgeon: Donnamae Jude, MD;  [facility]: [facility];  Service: Obstetrics;  Laterality: N/A;  . CESAREAN SECTION    . CHOLECYSTECTOMY     Family History  Adopted: Yes  Problem Relation Age of Onset  . Healthy Mother   . Healthy Father     Social History Reviewed with no changes to be made today.   Outpatient Medications Prior to Visit  Medication Sig Dispense Refill  . FLUoxetine (PROZAC) 20 MG capsule Take 20 mg by mouth daily.    . phentermine (ADIPEX-P) 37.5 MG tablet Take 1 tablet (37.5 mg total) by mouth daily before breakfast. 30 tablet 0   No facility-administered medications prior to visit.     Allergies  Allergen Reactions  . Bee Venom Swelling       Objective:    BP 96/65   Pulse 92   Resp 16   Ht $R'5\' 4"'zW$  (1.626 m)   Wt (!) 365 lb 9.6 oz (165.8 kg)   SpO2 97%   BMI 62.76 kg/m  Wt Readings from Last 3 Encounters:  12/14/20 (!) 365 lb 9.6 oz (165.8 kg)  05/15/20 (!) 369 lb 12.8 oz (167.7 kg)  02/25/20 (!) 310 lb (140.6 kg)    Physical Exam       Patient has been counseled extensively about nutrition and exercise as well as the importance of adherence with medications and regular follow-up. The patient was given clear instructions to go to ER or return to medical center if symptoms don't improve, worsen or new problems develop. The patient verbalized understanding.   Follow-up: Return for If able to take saxenda will need to see back in 4-6 weeks for weight check and repeat labs. Gildardo Pounds, FNP-BC [facility] [place], [place]   12/19/2020, 11:50 PM

## 2020-12-20 ENCOUNTER — Encounter: Payer: Self-pay | Admitting: Nurse Practitioner

## 2020-12-30 ENCOUNTER — Telehealth: Payer: Self-pay | Admitting: Nurse Practitioner

## 2020-12-30 MED ORDER — SAXENDA 18 MG/3ML ~~LOC~~ SOPN: PEN_INJECTOR | SUBCUTANEOUS | 3 refills | Status: DC

## 2020-12-30 NOTE — Telephone Encounter (Signed)
Patient spoke with [facility]  and they deny receiving Liraglutide -Weight Management (SAXENDA) 18 MG/3ML SOPN and would like rx re faxed. Patient is requesting a follow up call when completed.

## 2021-01-04 NOTE — Telephone Encounter (Signed)
Patient is needing an alternate weight management medication due to insurance denying coverage per patient.

## 2021-01-04 NOTE — Telephone Encounter (Signed)
Pt called saying the medication for her weight loss is not covered for 3 months  CB#  [phone]

## 2021-01-06 ENCOUNTER — Other Ambulatory Visit: Payer: Self-pay | Admitting: Nurse Practitioner

## 2021-01-06 DIAGNOSIS — Z6841 Body Mass Index (BMI) 40.0 and over, adult: Secondary | ICD-10-CM

## 2021-01-06 NOTE — Telephone Encounter (Signed)
Referring to bariatric clinic

## 2021-01-06 NOTE — Telephone Encounter (Signed)
Returned pt call and made aware °

## 2021-01-11 ENCOUNTER — Ambulatory Visit: Payer: Medicare Other | Admitting: Obstetrics and Gynecology

## 2021-01-30 ENCOUNTER — Encounter (INDEPENDENT_AMBULATORY_CARE_PROVIDER_SITE_OTHER): Payer: Self-pay

## 2021-02-15 ENCOUNTER — Telehealth: Payer: Self-pay | Admitting: Nurse Practitioner

## 2021-02-15 NOTE — Telephone Encounter (Signed)
Provide Meredeth Ide working virtual 5/11. Called Pt no answer Left vm that appt is virtual. If in person is preferred to call [phone] to reschedule

## 2021-02-17 ENCOUNTER — Ambulatory Visit: Payer: Medicare Other | Attending: Nurse Practitioner | Admitting: Nurse Practitioner

## 2021-02-17 ENCOUNTER — Other Ambulatory Visit: Payer: Self-pay

## 2021-02-17 ENCOUNTER — Encounter: Payer: Self-pay | Admitting: Nurse Practitioner

## 2021-02-17 DIAGNOSIS — Z6841 Body Mass Index (BMI) 40.0 and over, adult: Secondary | ICD-10-CM

## 2021-02-17 NOTE — Patient Instructions (Signed)
t

## 2021-02-17 NOTE — Progress Notes (Signed)
Virtual Visit via Telephone Note Due to national recommendations of social distancing due to COVID 19, telehealth visit is felt to be most appropriate for this patient at this time.  I discussed the limitations, risks, security and privacy concerns of performing an evaluation and management service by telephone and the availability of in person appointments. I also discussed with the patient that there may be a patient responsible charge related to this service. The patient expressed understanding and agreed to proceed.    I connected with Valerie Adkins on 02/17/21  at   3:30 PM EDT  EDT by telephone and verified that I am speaking with the correct person using two identifiers.   Consent I discussed the limitations, risks, security and privacy concerns of performing an evaluation and management service by telephone and the availability of in person appointments. I also discussed with the patient that there may be a patient responsible charge related to this service. The patient expressed understanding and agreed to proceed.   Location of Patient: Private Residence   Location of Provider: [facility]    Persons participating in Telemedicine visit: Valerie Denver FNP-BC YY [place] CMA Valerie Adkins    History of Present Illness: Telemedicine visit for: follow up obesity   At her last office visit with me in march we discussed weight loss and at that time we started her on saxenda however her insurance would not cover this. I then referred her to the weight management clinic. She has an upcoming appointment next week. At this time she has no questions or concerns. She is prediabetic and we will be checking her A1c in a few weeks     Past Medical History:  Diagnosis Date  . Hypertension   . Morbid obesity (HCC)     Past Surgical History:  Procedure Laterality Date  . CESAREAN SECTION    . CESAREAN SECTION N/A 05/18/2019   Procedure:  CESAREAN SECTION AND IUD INSERTION;  Surgeon: Reva Bores, MD;  Location: [facility];  Service: Obstetrics;  Laterality: N/A;  . CESAREAN SECTION    . CHOLECYSTECTOMY      Family History  Adopted: Yes  Problem Relation Age of Onset  . Healthy Mother   . Healthy Father     Social History   Socioeconomic History  . Marital status: Significant Other    Spouse name: Not on file  . Number of children: Not on file  . Years of education: Not on file  . Highest education level: Not on file  Occupational History  . Occupation: [employer]  Tobacco Use  . Smoking status: Never Smoker  . Smokeless tobacco: Never Used  Vaping Use  . Vaping Use: Never used  Substance and Sexual Activity  . Alcohol use: Never  . Drug use: Never  . Sexual activity: Yes    Birth control/protection: I.U.D.  Other Topics Concern  . Not on file  Social History Narrative  . Not on file   Social Determinants of Health   Financial Resource Strain: Not on file  Food Insecurity: Not on file  Transportation Needs: Not on file  Physical Activity: Not on file  Stress: Not on file  Social Connections: Not on file     Observations/Objective: Awake, alert and oriented x 3   Review of Systems  Constitutional: Negative for fever, malaise/fatigue and weight loss.  HENT: Negative.  Negative for nosebleeds.   Eyes: Negative.  Negative for blurred vision, double vision and photophobia.  Respiratory: Negative.  Negative for cough and shortness of breath.   Cardiovascular: Negative.  Negative for chest pain, palpitations and leg swelling.  Gastrointestinal: Negative.  Negative for heartburn, nausea and vomiting.  Musculoskeletal: Negative.  Negative for myalgias.  Neurological: Negative.  Negative for dizziness, focal weakness, seizures and headaches.  Psychiatric/Behavioral: Negative.  Negative for suicidal ideas.    Assessment and Plan: Diagnoses and all orders for this visit:  Morbid obesity with BMI  of 60.0-69.9, adult (HCC) FU with [facility] weight management clinic as instructed.   Discussed diet and exercise for person with BMI >60. Instructed: You must burn more calories than you eat. Losing 5 percent of your body weight should be considered a success. In the longer term, losing more than 15 percent of your body weight and staying at this weight is an extremely good result. However, keep in mind that even losing 5 percent of your body weight leads to important health benefits, so try not to get discouraged if you're not able to lose more than this. Will recheck weight in 3-6 months.  Follow Up Instructions Return if symptoms worsen or fail to improve.     I discussed the assessment and treatment plan with the patient. The patient was provided an opportunity to ask questions and all were answered. The patient agreed with the plan and demonstrated an understanding of the instructions.   The patient was advised to call back or seek an in-person evaluation if the symptoms worsen or if the condition fails to improve as anticipated.  I provided 10 minutes of non-face-to-face time during this encounter including median intraservice time, reviewing previous notes, labs, imaging, medications and explaining diagnosis and management.  Claiborne Rigg, FNP-BC

## 2021-02-21 ENCOUNTER — Other Ambulatory Visit: Payer: Self-pay

## 2021-02-21 ENCOUNTER — Encounter (HOSPITAL_COMMUNITY): Payer: Self-pay | Admitting: Emergency Medicine

## 2021-02-21 ENCOUNTER — Emergency Department (HOSPITAL_COMMUNITY)
Admission: EM | Admit: 2021-02-21 | Discharge: 2021-02-21 | Discharge status: Against Medical Advice | Payer: Medicare Other | Attending: Emergency Medicine | Admitting: Emergency Medicine

## 2021-02-21 DIAGNOSIS — N939 Abnormal uterine and vaginal bleeding, unspecified: Secondary | ICD-10-CM | POA: Diagnosis not present

## 2021-02-21 DIAGNOSIS — R1030 Lower abdominal pain, unspecified: Secondary | ICD-10-CM | POA: Insufficient documentation

## 2021-02-21 DIAGNOSIS — R309 Painful micturition, unspecified: Secondary | ICD-10-CM | POA: Diagnosis not present

## 2021-02-21 DIAGNOSIS — Z5321 Procedure and treatment not carried out due to patient leaving prior to being seen by health care provider: Secondary | ICD-10-CM | POA: Insufficient documentation

## 2021-02-21 LAB — URINALYSIS, ROUTINE W REFLEX MICROSCOPIC
Bilirubin Urine: NEGATIVE
Glucose, UA: NEGATIVE mg/dL
Ketones, ur: NEGATIVE mg/dL
Leukocytes,Ua: NEGATIVE
Nitrite: NEGATIVE
Protein, ur: 30 mg/dL — AB
RBC / HPF: >50 RBC/hpf — ABNORMAL HIGH (ref 0–5)
Specific Gravity, Urine: 1.025 (ref 1.005–1.030)
pH: 5 (ref 5.0–8.0)

## 2021-02-21 LAB — PREGNANCY, URINE: Preg Test, Ur: NEGATIVE

## 2021-02-21 NOTE — ED Provider Notes (Signed)
Emergency Medicine Provider Triage Evaluation Note  Valerie Adkins , a 30 y.o. female  was evaluated in triage.  Pt complains of vaginal bleeding and burning with urination  Review of Systems  Positive:  Negative: fever  Physical Exam  BP (!) 148/95 (BP Location: Right Wrist)   Pulse (!) 109   Temp 99.2 F (37.3 C) (Oral)   Resp 16   Ht 5\' 4"  (1.626 m)   Wt (!) 145.2 kg   SpO2 100%   BMI 54.93 kg/m  Gen:   Awake, no distress   Resp:  Normal effort  MSK:   Moves extremities without difficulty  Other:    Medical Decision Making  Medically screening exam initiated at 3:26 PM.  Appropriate orders placed.  Valerie Adkins was informed that the remainder of the evaluation will be completed by another provider, this initial triage assessment does not replace that evaluation, and the importance of remaining in the ED until their evaluation is complete.     , Valerie Adkins 02/21/21 1527    Valerie Adkins, 02/23/21, MD 02/21/21 2035

## 2021-02-21 NOTE — ED Triage Notes (Signed)
Pt c/o pain with urination, increasing vaginal bleeding and lower abdominal pain x 2 days.

## 2021-02-21 NOTE — ED Notes (Signed)
Patient states she is leaving d/t wait time 

## 2021-02-25 ENCOUNTER — Encounter (INDEPENDENT_AMBULATORY_CARE_PROVIDER_SITE_OTHER): Payer: Self-pay | Admitting: Family Medicine

## 2021-02-25 ENCOUNTER — Ambulatory Visit (INDEPENDENT_AMBULATORY_CARE_PROVIDER_SITE_OTHER): Payer: Medicare Other | Admitting: Family Medicine

## 2021-02-25 ENCOUNTER — Other Ambulatory Visit: Payer: Self-pay

## 2021-02-25 VITALS — BP 138/64 | HR 81 | Temp 98.0°F | Ht 64.0 in | Wt 357.0 lb

## 2021-02-25 DIAGNOSIS — F3289 Other specified depressive episodes: Secondary | ICD-10-CM

## 2021-02-25 DIAGNOSIS — I10 Essential (primary) hypertension: Secondary | ICD-10-CM

## 2021-02-25 DIAGNOSIS — R7303 Prediabetes: Secondary | ICD-10-CM

## 2021-02-25 DIAGNOSIS — Z6841 Body Mass Index (BMI) 40.0 and over, adult: Secondary | ICD-10-CM

## 2021-02-25 DIAGNOSIS — R0602 Shortness of breath: Secondary | ICD-10-CM

## 2021-02-25 DIAGNOSIS — R5383 Other fatigue: Secondary | ICD-10-CM | POA: Insufficient documentation

## 2021-02-25 DIAGNOSIS — Z9189 Other specified personal risk factors, not elsewhere classified: Secondary | ICD-10-CM

## 2021-02-25 DIAGNOSIS — Z0289 Encounter for other administrative examinations: Secondary | ICD-10-CM

## 2021-02-25 DIAGNOSIS — F32A Depression, unspecified: Secondary | ICD-10-CM | POA: Insufficient documentation

## 2021-02-26 LAB — LIPID PANEL
Chol/HDL Ratio: 3.7 ratio (ref 0.0–4.4)
Cholesterol, Total: 127 mg/dL (ref 100–199)
HDL: 34 mg/dL — ABNORMAL LOW (ref >39)
LDL Chol Calc (NIH): 75 mg/dL (ref 0–99)
Triglycerides: 92 mg/dL (ref 0–149)
VLDL Cholesterol Cal: 18 mg/dL (ref 5–40)

## 2021-02-26 LAB — CBC WITH DIFFERENTIAL/PLATELET
Basophils Absolute: 0 10*3/uL (ref 0.0–0.2)
Basos: 1 %
EOS (ABSOLUTE): 0.1 10*3/uL (ref 0.0–0.4)
Eos: 2 %
Hematocrit: 39.5 % (ref 34.0–46.6)
Hemoglobin: 12.6 g/dL (ref 11.1–15.9)
Immature Grans (Abs): 0 10*3/uL (ref 0.0–0.1)
Immature Granulocytes: 0 %
Lymphocytes Absolute: 1.5 10*3/uL (ref 0.7–3.1)
Lymphs: 32 %
MCH: 24.9 pg — ABNORMAL LOW (ref 26.6–33.0)
MCHC: 31.9 g/dL (ref 31.5–35.7)
MCV: 78 fL — ABNORMAL LOW (ref 79–97)
Monocytes Absolute: 0.4 10*3/uL (ref 0.1–0.9)
Monocytes: 8 %
Neutrophils Absolute: 2.7 10*3/uL (ref 1.4–7.0)
Neutrophils: 57 %
Platelets: 283 10*3/uL (ref 150–450)
RBC: 5.07 x10E6/uL (ref 3.77–5.28)
RDW: 15.4 % (ref 11.7–15.4)
WBC: 4.7 10*3/uL (ref 3.4–10.8)

## 2021-02-26 LAB — COMPREHENSIVE METABOLIC PANEL
ALT: 13 IU/L (ref 0–32)
AST: 15 IU/L (ref 0–40)
Albumin/Globulin Ratio: 1 — ABNORMAL LOW (ref 1.2–2.2)
Albumin: 4 g/dL (ref 3.9–5.0)
Alkaline Phosphatase: 63 IU/L (ref 44–121)
BUN/Creatinine Ratio: 17 (ref 9–23)
BUN: 9 mg/dL (ref 6–20)
Bilirubin Total: 0.3 mg/dL (ref 0.0–1.2)
CO2: 23 mmol/L (ref 20–29)
Calcium: 9.2 mg/dL (ref 8.7–10.2)
Chloride: 102 mmol/L (ref 96–106)
Creatinine, Ser: 0.53 mg/dL — ABNORMAL LOW (ref 0.57–1.00)
Globulin, Total: 4 g/dL (ref 1.5–4.5)
Glucose: 88 mg/dL (ref 65–99)
Potassium: 4.2 mmol/L (ref 3.5–5.2)
Sodium: 141 mmol/L (ref 134–144)
Total Protein: 8 g/dL (ref 6.0–8.5)
eGFR: 128 mL/min/{1.73_m2} (ref >59)

## 2021-02-26 LAB — INSULIN, RANDOM: INSULIN: 27.5 u[IU]/mL — ABNORMAL HIGH (ref 2.6–24.9)

## 2021-02-26 LAB — HEMOGLOBIN A1C
Est. average glucose Bld gHb Est-mCnc: 126 mg/dL
Hgb A1c MFr Bld: 6 % — ABNORMAL HIGH (ref 4.8–5.6)

## 2021-02-26 LAB — VITAMIN D 25 HYDROXY (VIT D DEFICIENCY, FRACTURES): Vit D, 25-Hydroxy: 16.3 ng/mL — ABNORMAL LOW (ref 30.0–100.0)

## 2021-02-26 LAB — FOLATE: Folate: 4.4 ng/mL (ref >3.0)

## 2021-02-26 LAB — TSH: TSH: 1.6 u[IU]/mL (ref 0.450–4.500)

## 2021-02-26 LAB — T4, FREE: Free T4: 1.15 ng/dL (ref 0.82–1.77)

## 2021-02-26 LAB — VITAMIN B12: Vitamin B-12: 476 pg/mL (ref 232–1245)

## 2021-03-03 NOTE — Progress Notes (Signed)
Dear Dr. Meredeth Ide,   Thank you for referring Valerie Adkins to our clinic. The following note includes my evaluation and treatment recommendations.  Chief Complaint:   OBESITY Valerie Adkins (MR# [id number]) is a 30 y.o. female who presents for evaluation and treatment of obesity and related comorbidities. Current BMI is Body mass index is 61.28 kg/m. Valerie Adkins has been struggling with her weight for many years and has been unsuccessful in either losing weight, maintaining weight loss, or reaching her healthy weight goal.  Valerie Adkins is currently in the action stage of change and ready to dedicate time achieving and maintaining a healthier weight. Valerie Adkins is interested in becoming our patient and working on intensive lifestyle modifications including (but not limited to) diet and exercise for weight loss.  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  She lives with her boyfriend, Valerie Adkins, and their 47 and 35-year-old children.  Craves pizza, chips, and candy.  Skips breakfast.  Drinks caloric drinks,  Eats out once a day usually.  They eat a lot of fried foods and boyfriend is even heavier than her.  In the past, she was on phentermine and Saxenda without success, she states.  Valerie Adkins habits were reviewed today and are as follows: Her family eats meals together, she thinks her family will eat healthier with her, her desired weight loss is 100 pounds, she started gaining weight after her mother passed, her heaviest weight ever was 390 pounds, she is a picky eater and doesn't like to eat healthier foods, she craves pizza, she snacks frequently in the evenings, she wakes up frequently in the middle of the night to eat, she skips breakfast frequently, she is frequently drinking liquids with calories, she frequently makes poor food choices, she frequently eats larger portions than normal and she struggles with emotional eating.  Depression Screen Valerie Adkins's Food and Mood  (modified PHQ-9) score was 6.  Depression screen PHQ 2/9 02/25/2021  Decreased Interest 1  Down, Depressed, Hopeless 1  PHQ - 2 Score 2  Altered sleeping 0  Tired, decreased energy 1  Change in appetite 1  Feeling bad or failure about yourself  0  Trouble concentrating 1  Moving slowly or fidgety/restless 1  Suicidal thoughts 0  PHQ-9 Score 6  Difficult doing work/chores Not difficult at all   Assessment/Plan:   Orders Placed This Encounter  Procedures  . Vitamin B12  . CBC with Differential/Platelet  . Comprehensive metabolic panel  . Folate  . Hemoglobin A1c  . Insulin, random  . Lipid panel  . T4, free  . TSH  . VITAMIN D 25 Hydroxy (Vit-D Deficiency, Fractures)  . EKG 12-Lead   Medications Discontinued During This Encounter  Medication Reason  . Liraglutide -Weight Management (SAXENDA) 18 MG/3ML SOPN Prescription never filled    1. Other fatigue Valerie Adkins admits to daytime somnolence and admits to waking up still tired. Patent has a history of symptoms of daytime fatigue, morning fatigue, morning headache and snoring. Novalie generally gets 8 hours of sleep per night, and states that she has generally restful sleep. Snoring is present. Apneic episodes are present. Epworth Sleepiness Score is 11.  She does not sleep well because she is up with the kids and waiting on her boyfriend to get home.  She says that there are no issues with sleep at all.  Shifa does feel that her weight is causing her energy to be lower than it should be. Fatigue may be related to obesity, depression or  many other causes. Labs will be ordered, and in the meanwhile, [facility] will focus on self care including making healthy food choices, increasing physical activity and focusing on stress reduction.  Will check EKG and labs today.  - EKG 12-Lead - Vitamin B12 - CBC with Differential/Platelet - Comprehensive metabolic panel - Folate - Lipid panel - T4, free - TSH - VITAMIN D 25 Hydroxy (Vit-D  Deficiency, Fractures)  2. SOBOE (shortness of breath on exertion) Valerie Adkins notes increasing shortness of breath with exercising and seems to be worsening over time with weight gain. She notes getting out of breath sooner with activity than she used to. This has gotten worse recently. Valerie Adkins denies shortness of breath at rest or orthopnea.  Valerie Adkins does feel that she gets out of breath more easily that she used to when she exercises. Shelvia's shortness of breath appears to be obesity related and exercise induced. She has agreed to work on weight loss and gradually increase exercise to treat her exercise induced shortness of breath. Will continue to monitor closely.  Will check IC and labs today.  - Vitamin B12 - CBC with Differential/Platelet - Comprehensive metabolic panel - Folate - Lipid panel - T4, free - TSH - VITAMIN D 25 Hydroxy (Vit-D Deficiency, Fractures)  3. Essential hypertension At goal. Medications: None.  History of pre-eclampsia with pregnancy.  She was on blood pressure medication for 1 week.  No medications since.  Plan:  Will check labs today. Avoid buying foods that are: processed, frozen, or prepackaged to avoid excess salt. We will continue to monitor closely alongside her PCP and/or Specialist.  Regular follow up with PCP and specialists was also encouraged.    BP Readings from Last 3 Encounters:  02/25/21 138/64  02/21/21 120/89  12/14/20 96/65   Lab Results  Component Value Date   CREATININE 0.53 (L) 02/25/2021   - Vitamin B12 - CBC with Differential/Platelet - Comprehensive metabolic panel - Folate - Lipid panel - T4, free - TSH - VITAMIN D 25 Hydroxy (Vit-D Deficiency, Fractures)  4. Prediabetes Not at goal. Goal is HgbA1c < 5.7.  Medication: None.  She was first diagnosed in early 2022.  Plan:  Will check CMP, A1c and insulin level today.  She will continue to focus on protein-rich, low simple carbohydrate foods. We reviewed the importance of  hydration, regular exercise for stress reduction, and restorative sleep.   Lab Results  Component Value Date   HGBA1C 6.0 (H) 02/25/2021   Lab Results  Component Value Date   INSULIN 27.5 (H) 02/25/2021   - Comprehensive metabolic panel - Hemoglobin A1c - Insulin, random  5. Other depression, with emotional eating Not at goal. Medication: Prozac 20 mg daily.  History of ADD as well.  PHQ-9 is 6.  Goes to see her psychiatrist at [facility] and sees a counselor there every 2-4 weeks via telehealth.  Stable mood today.  Plan:  Treatment plan per Psychiatry and counselor.  Will monitor alongside specialists and focus on emotional eating strategies with patient.  Behavior modification techniques were discussed today to help deal with emotional/non-hunger eating behaviors.  6. Class 3 severe obesity with serious comorbidity and body mass index (BMI) of 60.0 to 69.9 in adult, unspecified obesity type (HCC)  Valerie Adkins is currently in the action stage of change and her goal is to continue with weight loss efforts. I recommend Valerie Adkins begin the structured treatment plan as follows:  She has agreed to the Category 3 Plan.  Exercise goals: As  is.   Behavioral modification strategies: decreasing liquid calories, no skipping meals, avoiding temptations and planning for success.  She was informed of the importance of frequent follow-up visits to maximize her success with intensive lifestyle modifications for her multiple health conditions. She was informed we would discuss her lab results at her next visit unless there is a critical issue that needs to be addressed sooner. Valerie Adkins agreed to keep her next visit at the agreed upon time to discuss these results.  Objective:   Blood pressure 138/64, pulse 81, temperature 98 F (36.7 C), height 5\' 4"  (1.626 m), weight (!) 357 lb (161.9 kg), SpO2 98 %, unknown if currently breastfeeding. Body mass index is 61.28 kg/m.  EKG: Normal sinus rhythm, rate 81  bpm.  Indirect Calorimeter completed today shows a VO2 of 324 and a REE of 2252.  Her calculated basal metabolic rate is thus her basal metabolic rate is better than expected.  General: Cooperative, alert, well developed, in no acute distress. HEENT: Conjunctivae and lids unremarkable. Cardiovascular: Regular rhythm.  Lungs: Normal work of breathing. Neurologic: No focal deficits.   Lab Results  Component Value Date   CREATININE 0.53 (L) 02/25/2021   BUN 9 02/25/2021   NA 141 02/25/2021   K 4.2 02/25/2021   CL 102 02/25/2021   CO2 23 02/25/2021   Lab Results  Component Value Date   ALT 13 02/25/2021   AST 15 02/25/2021   ALKPHOS 63 02/25/2021   BILITOT 0.3 02/25/2021   Lab Results  Component Value Date   HGBA1C 6.0 (H) 02/25/2021   HGBA1C 6.1 (H) 12/14/2020   HGBA1C 5.9 (H) 02/28/2020   Lab Results  Component Value Date   INSULIN 27.5 (H) 02/25/2021   Lab Results  Component Value Date   TSH 1.600 02/25/2021   Lab Results  Component Value Date   CHOL 127 02/25/2021   HDL 34 (L) 02/25/2021   LDLCALC 75 02/25/2021   TRIG 92 02/25/2021   CHOLHDL 3.7 02/25/2021   Lab Results  Component Value Date   WBC 4.7 02/25/2021   HGB 12.6 02/25/2021   HCT 39.5 02/25/2021   MCV 78 (L) 02/25/2021   PLT 283 02/25/2021   Obesity Behavioral Intervention:   Approximately 15 minutes were spent on the discussion below.  ASK: We discussed the diagnosis of obesity with Valerie Adkins today and Valerie Adkins agreed to give 02/27/2021 permission to discuss obesity behavioral modification therapy today.  ASSESS: Latiqua has the diagnosis of obesity and her BMI today is 61.4. Saida is in the action stage of change.   ADVISE: Valerie Adkins was educated on the multiple health risks of obesity as well as the benefit of weight loss to improve her health. She was advised of the need for long term treatment and the importance of lifestyle modifications to improve her current health and to decrease her  risk of future health problems.  AGREE: Multiple dietary modification options and treatment options were discussed and Valerie Adkins agreed to follow the recommendations documented in the above note.  ARRANGE: Valerie Adkins was educated on the importance of frequent visits to treat obesity as outlined per CMS and USPSTF guidelines and agreed to schedule her next follow up appointment today.  Attestation Statements:   This is the patient's first visit at [facility]. The patient's NEW PATIENT PACKET was reviewed at length. Included in the packet: current and past health history, medications, allergies, ROS, gynecologic history (women only), surgical history, family history, social history, weight history,  weight loss surgery history (for those that have had weight loss surgery), nutritional evaluation, mood and food questionnaire, PHQ9, Epworth questionnaire, sleep habits questionnaire, patient life and health improvement goals questionnaire. These will all be scanned into the patient's chart under media.   During the visit, I independently reviewed the patient's EKG, bioimpedance scale results, and indirect calorimeter results. I used this information to tailor a meal plan for the patient that will help her to lose weight and will improve her obesity-related conditions going forward. I performed a medically necessary appropriate examination and/or evaluation. I discussed the assessment and treatment plan with the patient. The patient was provided an opportunity to ask questions and all were answered. The patient agreed with the plan and demonstrated an understanding of the instructions. Labs were ordered at this visit and will be reviewed at the next visit unless more critical results need to be addressed immediately. Clinical information was updated and documented in the EMR.   I, Insurance claims handlerAmber Agner, CMA, am acting as Energy managertranscriptionist for Marsh & McLennanDeborah Diane Mochizuki, DO.  I have reviewed the above documentation  for accuracy and completeness, and I agree with the above. Carlye Grippe- Trequan Marsolek J Marybell Robards, D.O.  The 21st Century Cures Act was signed into law in 2016 which includes the topic of electronic health records.  This provides immediate access to information in MyChart.  This includes consultation notes, operative notes, office notes, lab results and pathology reports.  If you have any questions about what you read please let us know at your next visit so we can discuss your concerns and take corrective action if need be.  We are right here with you.

## 2021-03-11 ENCOUNTER — Other Ambulatory Visit: Payer: Self-pay

## 2021-03-11 ENCOUNTER — Encounter (INDEPENDENT_AMBULATORY_CARE_PROVIDER_SITE_OTHER): Payer: Self-pay | Admitting: Family Medicine

## 2021-03-11 ENCOUNTER — Ambulatory Visit (INDEPENDENT_AMBULATORY_CARE_PROVIDER_SITE_OTHER): Payer: Medicare Other | Admitting: Family Medicine

## 2021-03-11 VITALS — BP 135/89 | HR 93 | Temp 98.3°F | Ht 64.0 in | Wt 356.0 lb

## 2021-03-11 DIAGNOSIS — R030 Elevated blood-pressure reading, without diagnosis of hypertension: Secondary | ICD-10-CM | POA: Diagnosis not present

## 2021-03-11 DIAGNOSIS — R748 Abnormal levels of other serum enzymes: Secondary | ICD-10-CM | POA: Diagnosis not present

## 2021-03-11 DIAGNOSIS — E559 Vitamin D deficiency, unspecified: Secondary | ICD-10-CM | POA: Insufficient documentation

## 2021-03-11 DIAGNOSIS — Z6841 Body Mass Index (BMI) 40.0 and over, adult: Secondary | ICD-10-CM

## 2021-03-11 DIAGNOSIS — D649 Anemia, unspecified: Secondary | ICD-10-CM | POA: Insufficient documentation

## 2021-03-11 DIAGNOSIS — E66813 Obesity, class 3: Secondary | ICD-10-CM

## 2021-03-11 DIAGNOSIS — R7303 Prediabetes: Secondary | ICD-10-CM

## 2021-03-11 DIAGNOSIS — D508 Other iron deficiency anemias: Secondary | ICD-10-CM | POA: Diagnosis not present

## 2021-03-11 DIAGNOSIS — Z72820 Sleep deprivation: Secondary | ICD-10-CM

## 2021-03-11 MED ORDER — VITAMIN D (ERGOCALCIFEROL) 1.25 MG (50000 UNIT) PO CAPS: 50000.0000 [IU] | ORAL_CAPSULE | ORAL | 0 refills | Status: AC

## 2021-03-16 ENCOUNTER — Other Ambulatory Visit (HOSPITAL_COMMUNITY): Payer: Self-pay

## 2021-03-16 MED ORDER — HYDROXYZINE HCL 25 MG PO TABS
ORAL_TABLET | ORAL | 2 refills | Status: AC
  Filled 2021-03-16: qty 60, 30d supply, fill #0

## 2021-03-23 NOTE — Progress Notes (Deleted)
TeleHealth Visit:  Due to the COVID-19 pandemic, this visit was completed with telemedicine (audio/video) technology to reduce patient and provider exposure as well as to preserve personal protective equipment.   Valerie Adkins has verbally consented to this TeleHealth visit. The patient is located at home, the provider is located at the [facility] office. The participants in this visit include the listed provider and patient and ***. The visit was conducted today via ***. Valerie Adkins was unable to use realtime audiovisual technology today and the telehealth visit was conducted via telephone.  Chief Complaint: OBESITY Valerie Adkins is here to discuss her progress with her obesity treatment plan along with follow-up of her obesity related diagnoses. Valerie Adkins is on {MWMwtlossportion/plan2:23431} and states she is following her eating plan approximately ***% of the time. Valerie Adkins states she is *** *** minutes *** times per week.  Today's visit was #: *** Starting weight: *** Starting date: ***  Interim History: ***  Subjective:   1. Prediabetes   2. Elevated blood pressure reading   3. Low serum HDL   4. Other iron deficiency anemia   5. Vitamin D deficiency   6. Obesity, current BMI 61.2   Assessment/Plan:   1. Prediabetes   2. Elevated blood pressure reading   3. Low serum HDL   4. Other iron deficiency anemia   5. Vitamin D deficiency  - Vitamin D, Ergocalciferol, (DRISDOL) 1.25 MG (50000 UNIT) CAPS capsule; Take 1 capsule (50,000 Units total) by mouth every 7 (seven) days.  Dispense: 4 capsule; Refill: 0  6. Obesity, current BMI 61.2   Valerie Adkins {CHL AMB IS/IS NOT:210130109} currently in the action stage of change. As such, her goal is to {MWMwtloss#1:210800005}. She has agreed to {MWMwtlossportion/plan2:23431}.   Exercise goals: {MWM EXERCISE RECS:23473}  Behavioral modification strategies: {MWMwtlossdietstrategies3:23432}.  Valerie Adkins has agreed to  follow-up with our clinic in {NUMBER 1-10:22536} weeks. She was informed of the importance of frequent follow-up visits to maximize her success with intensive lifestyle modifications for her multiple health conditions.  Valerie Adkins if no labs orderedShatley was informed we would discuss her lab results at her next visit unless there is a critical issue that needs to be addressed sooner. Valerie Adkins agreed to keep her next visit at the agreed upon time to discuss these results.  Objective:   VITALS: Per patient if applicable, see vitals. GENERAL: Alert and in no acute distress. CARDIOPULMONARY: No increased WOB. Speaking in clear sentences.  PSYCH: Pleasant and cooperative. Speech normal rate and rhythm. Affect is appropriate. Insight and judgement are appropriate. Attention is focused, linear, and appropriate.  NEURO: Oriented as arrived to appointment on time with no prompting.   Lab Results  Component Value Date   CREATININE 0.53 (L) 02/25/2021   BUN 9 02/25/2021   NA 141 02/25/2021   K 4.2 02/25/2021   CL 102 02/25/2021   CO2 23 02/25/2021   Lab Results  Component Value Date   ALT 13 02/25/2021   AST 15 02/25/2021   ALKPHOS 63 02/25/2021   BILITOT 0.3 02/25/2021   Lab Results  Component Value Date   HGBA1C 6.0 (H) 02/25/2021   HGBA1C 6.1 (H) 12/14/2020   HGBA1C 5.9 (H) 02/28/2020   Lab Results  Component Value Date   INSULIN 27.5 (H) 02/25/2021   Lab Results  Component Value Date   TSH 1.600 02/25/2021   Lab Results  Component Value Date   CHOL 127 02/25/2021   HDL 34 (L) 02/25/2021   LDLCALC 75 02/25/2021  TRIG 92 02/25/2021   CHOLHDL 3.7 02/25/2021   Lab Results  Component Value Date   WBC 4.7 02/25/2021   HGB 12.6 02/25/2021   HCT 39.5 02/25/2021   MCV 78 (L) 02/25/2021   PLT 283 02/25/2021   No results found for: IRON, TIBC, FERRITIN  Attestation Statements:   Reviewed by clinician on day of visit: allergies, medications, problem list, medical history,  surgical history, family history, social history, and previous encounter notes.  if time-based billing not used)Time spent on visit including pre-visit chart review and post-visit charting and care was *** minutes.   I, ***, am acting as transcriptionist for ***.  I have reviewed the above documentation for accuracy and completeness, and I agree with the above. - ***

## 2021-03-23 NOTE — Progress Notes (Signed)
Chief Complaint:   OBESITY Valerie Adkins is here to discuss her progress with her obesity treatment plan along with follow-up of her obesity related diagnoses.   Today's visit was #: 2 Starting weight: 357 lbs Starting date: 02/25/2021 Today's weight: 356 lbs Today's date: 03/11/2021 Weight change since last visit: 1 lb Total lbs lost to date: 1 lb Body mass index is 61.11 kg/m.  Total weight loss percentage to date: -0.28%  Interim History:  Valerie Adkins is here today for her first follow-up office visit since starting the program with [place].  All blood work/ lab tests that were recently ordered by myself or an outside provider were reviewed with patient today per their request.   Extended time was spent counseling her on all new disease processes that were discovered or preexisting ones that are worsening.  she understands that many of these abnormalities will need to monitored regularly along with the current treatment plan of prudent dietary changes, in which we are making each and every office visit, to improve these health parameters.  We reviewed her new meal plan in detail and questions were answered.  Patient's food recall appears to be accurate and consistent with what is on plan when she is following it.   When eating on plan, her hunger and cravings are well controlled.    Valerie Adkins says she decreased her soda intake from 1 case of soda per day to 1-2 cans per day.  She stopped fried foods and is eating more chicken.  She snacks a lot at night.  Last night for dinner, she had lettuce, apple, and 1/2 chicken breast with regular [place] dressing.  She is using regular bread, regular milk.  Snacks on spinach dip with nacho chips.  Current Meal Plan: the Category 3 Plan for 10% of the time.  Current Exercise Plan: Walking for 10-20 minutes 2-3 times per week.  Assessment/Plan:   Meds ordered this encounter  Medications   Vitamin D, Ergocalciferol, (DRISDOL) 1.25 MG (50000  UNIT) CAPS capsule    Sig: Take 1 capsule (50,000 Units total) by mouth every 7 (seven) days.    Dispense:  4 capsule    Refill:  0    1. Prediabetes Not at goal. Goal is HgbA1c < 5.7.  Medication: None.    Plan:  Worsening.  Discussed labs with patient today.  She will continue to focus on protein-rich, low simple carbohydrate foods. We reviewed the importance of hydration, regular exercise for stress reduction, and restorative sleep.  Declines medication today.  Will consider.   Lab Results  Component Value Date   HGBA1C 6.0 (H) 02/25/2021   Lab Results  Component Value Date   INSULIN 27.5 (H) 02/25/2021   2. Elevated blood pressure reading Blood pressure was 105/95 at home yesterday.  Plan:  Decrease salt intake, increase water, follow prudent nutritional plan and weight loss, and eventually increase exercise.  Home blood pressure monitoring 3 days per week.  BP Readings from Last 3 Encounters:  03/11/21 135/89  02/25/21 138/64  02/21/21 120/89   Lab Results  Component Value Date   CREATININE 0.53 (L) 02/25/2021   3. Low serum HDL HDL was 34 on 02/25/2021.  Plan:  Increase exercise eventually.    Lab Results  Component Value Date   CHOL 127 02/25/2021   HDL 34 (L) 02/25/2021   LDLCALC 75 02/25/2021   TRIG 92 02/25/2021   CHOLHDL 3.7 02/25/2021   Lab Results  Component Value Date  ALT 13 02/25/2021   AST 15 02/25/2021   ALKPHOS 63 02/25/2021   BILITOT 0.3 02/25/2021   4. Other iron deficiency anemia Valerie Adkins is taking a daily multivitamin.  Plan:  Discussed labs with patient today.  Improved and at goal.  She is to continue her daily multivitamin.   Nutrition: Iron-rich foods include dark leafy greens, red and white meats, eggs, seafood, and beans.  Certain foods and drinks prevent your body from absorbing iron properly. Avoid eating these foods in the same meal as iron-rich foods or with iron supplements. These foods include: coffee, black tea, and red wine;  milk, dairy products, and foods that are high in calcium; beans and soybeans; whole grains. Constipation can be a side effect of iron supplementation. Increased water and fiber intake are helpful. Water goal: > 2 liters/day. Fiber goal: > 25 grams/day.  CBC Latest Ref Rng & Units 02/25/2021 12/14/2020 02/28/2020  WBC 3.4 - 10.8 x10E3/uL 4.7 6.0 7.2  Hemoglobin 11.1 - 15.9 g/dL 12.8 78.6 76.7  Hematocrit 34.0 - 46.6 % 39.5 39.7 38.3  Platelets 150 - 450 x10E3/uL 283 302 266   No results found for: IRON, TIBC, FERRITIN Lab Results  Component Value Date   VITAMINB12 476 02/25/2021   5. Poor sleep Stays up talking with friends, watching TV or TikTok etc.  Sleeps 4-5 hours mos nights.  Plan: Recommend sleep hygiene measures including regular sleep schedule, optimal sleep environment, and relaxing presleep rituals.   6. Vitamin D deficiency Not at goal. Current vitamin D is 16.3, tested on 02/25/2021. Optimal goal > 50 ng/dL.  She is not breastfeeding.  Plan: Start to take prescription Vitamin D @50 ,000 IU every week as prescribed.  Follow-up for routine testing of Vitamin D, at least 2-3 times per year to avoid over-replacement.  - Start Vitamin D, Ergocalciferol, (DRISDOL) 1.25 MG (50000 UNIT) CAPS capsule; Take 1 capsule (50,000 Units total) by mouth every 7 (seven) days.  Dispense: 4 capsule; Refill: 0  7. Obesity, current BMI 61.2  Course: Valerie Adkins is currently in the action stage of change. As such, her goal is to continue with weight loss efforts.   Nutrition goals: She has agreed to the Category 3 Plan.   Exercise goals:  As is.  Behavioral modification strategies: increasing lean protein intake, decreasing simple carbohydrates, meal planning and cooking strategies, and planning for success.  Valerie Adkins has agreed to follow-up with our clinic in 2 weeks. She was informed of the importance of frequent follow-up visits to maximize her success with intensive lifestyle modifications for her  multiple health conditions.   Objective:   Blood pressure 135/89, pulse 93, temperature 98.3 F (36.8 C), height 5\' 4"  (1.626 m), weight (!) 356 lb (161.5 kg), SpO2 97 %, unknown if currently breastfeeding. Body mass index is 61.11 kg/m.  General: Cooperative, alert, well developed, in no acute distress. HEENT: Conjunctivae and lids unremarkable. Cardiovascular: Regular rhythm.  Lungs: Normal work of breathing. Neurologic: No focal deficits.   Lab Results  Component Value Date   CREATININE 0.53 (L) 02/25/2021   BUN 9 02/25/2021   NA 141 02/25/2021   K 4.2 02/25/2021   CL 102 02/25/2021   CO2 23 02/25/2021   Lab Results  Component Value Date   ALT 13 02/25/2021   AST 15 02/25/2021   ALKPHOS 63 02/25/2021   BILITOT 0.3 02/25/2021   Lab Results  Component Value Date   HGBA1C 6.0 (H) 02/25/2021   HGBA1C 6.1 (H) 12/14/2020   HGBA1C  5.9 (H) 02/28/2020   Lab Results  Component Value Date   INSULIN 27.5 (H) 02/25/2021   Lab Results  Component Value Date   TSH 1.600 02/25/2021   Lab Results  Component Value Date   CHOL 127 02/25/2021   HDL 34 (L) 02/25/2021   LDLCALC 75 02/25/2021   TRIG 92 02/25/2021   CHOLHDL 3.7 02/25/2021   Lab Results  Component Value Date   WBC 4.7 02/25/2021   HGB 12.6 02/25/2021   HCT 39.5 02/25/2021   MCV 78 (L) 02/25/2021   PLT 283 02/25/2021   Attestation Statements:   Reviewed by clinician on day of visit: allergies, medications, problem list, medical history, surgical history, family history, social history, and previous encounter notes.  Time spent on visit including pre-visit chart review and post-visit care and charting was 50+ minutes.   I, Insurance claims handler, CMA, am acting as Energy manager for [employer], DO.  I have reviewed the above documentation for accuracy and completeness, and I agree with the above. Carlye Grippe, D.O.  The 21st Century Cures Act was signed into law in 2016 which includes the topic of  electronic health records.  This provides immediate access to information in MyChart.  This includes consultation notes, operative notes, office notes, lab results and pathology reports.  If you have any questions about what you read please let us know at your next visit so we can discuss your concerns and take corrective action if need be.  We are right here with you.

## 2021-03-25 ENCOUNTER — Ambulatory Visit (INDEPENDENT_AMBULATORY_CARE_PROVIDER_SITE_OTHER): Payer: Medicare Other | Admitting: Family Medicine

## 2021-04-05 ENCOUNTER — Other Ambulatory Visit (HOSPITAL_COMMUNITY): Payer: Self-pay

## 2021-04-20 ENCOUNTER — Ambulatory Visit (INDEPENDENT_AMBULATORY_CARE_PROVIDER_SITE_OTHER): Payer: Medicare Other | Admitting: Family Medicine

## 2021-05-29 ENCOUNTER — Ambulatory Visit (HOSPITAL_COMMUNITY)
Admission: EM | Admit: 2021-05-29 | Discharge: 2021-05-29 | Disposition: A | Discharge status: Home / Self Care | Payer: Medicare Other | Attending: Internal Medicine | Admitting: Internal Medicine

## 2021-05-29 ENCOUNTER — Other Ambulatory Visit: Payer: Self-pay

## 2021-05-29 ENCOUNTER — Encounter (HOSPITAL_COMMUNITY): Payer: Self-pay | Admitting: *Deleted

## 2021-05-29 DIAGNOSIS — N898 Other specified noninflammatory disorders of vagina: Secondary | ICD-10-CM | POA: Diagnosis present

## 2021-05-29 DIAGNOSIS — R300 Dysuria: Secondary | ICD-10-CM | POA: Insufficient documentation

## 2021-05-29 LAB — POCT URINALYSIS DIPSTICK, ED / UC
Bilirubin Urine: NEGATIVE
Glucose, UA: NEGATIVE mg/dL
Ketones, ur: NEGATIVE mg/dL
Nitrite: NEGATIVE
Protein, ur: NEGATIVE mg/dL
Specific Gravity, Urine: 1.025 (ref 1.005–1.030)
Urobilinogen, UA: 0.2 mg/dL (ref 0.0–1.0)
pH: 5.5 (ref 5.0–8.0)

## 2021-05-29 LAB — POC URINE PREG, ED: Preg Test, Ur: NEGATIVE

## 2021-05-29 MED ORDER — NITROFURANTOIN MONOHYD MACRO 100 MG PO CAPS: 100.0000 mg | ORAL_CAPSULE | Freq: Two times a day (BID) | ORAL | 0 refills | Status: AC

## 2021-05-29 NOTE — ED Triage Notes (Signed)
Pt reports pain when voiding and vag itching.

## 2021-05-29 NOTE — ED Provider Notes (Signed)
[facility]    CSN: [id number] Arrival date & time: 05/29/21  1615      History   Chief Complaint Chief Complaint  Patient presents with   Vaginal Itching   Dysuria    HPI Valerie Adkins is a 30 y.o. female.   HPI  Dysuria: Patient reports that she has had dysuria along with some vaginal irritation and discharge for the past 3 days.  She also has had some urinary frequency.  No pelvic pain, abdominal pain, fever to her knowledge, vomiting.  No new partners of concern.  She has not tried anything for symptoms.  Last menstrual cycle is unknown as she has an IUD.  She is not breast-feeding.   Past Medical History:  Diagnosis Date   ADD (attention deficit disorder)    ADHD    Back pain    Depression    Hypertension    Hypertension    Morbid obesity (HCC)    Prediabetes    SOBOE (shortness of breath on exertion)    Swelling of both lower extremities     Patient Active Problem List   Diagnosis Date Noted   Elevated blood pressure reading 03/11/2021   Low serum HDL 03/11/2021   Absolute anemia 03/11/2021   Vitamin D deficiency 03/11/2021   Other fatigue 02/25/2021   SOBOE (shortness of breath on exertion) 02/25/2021   Essential hypertension 02/25/2021   Prediabetes 02/25/2021   At risk for heart disease 02/25/2021   Depression 02/25/2021   Status post cesarean section 05/18/2019   History of cesarean section 11/14/2018   History of gestational hypertension 11/14/2018   Supervision of high-risk pregnancy 10/30/2018   Maternal morbid obesity, antepartum (HCC) 10/30/2018    Past Surgical History:  Procedure Laterality Date   CESAREAN SECTION     CESAREAN SECTION N/A 05/18/2019   Procedure: CESAREAN SECTION AND IUD INSERTION;  Surgeon: Reva Bores, MD;  Location: [facility];  Service: Obstetrics;  Laterality: N/A;   CESAREAN SECTION     CHOLECYSTECTOMY      OB History     Gravida  3   Para  3   Term  3   Preterm      AB       Living  3      SAB      IAB      Ectopic      Multiple  0   Live Births  3            Home Medications    Prior to Admission medications   Medication Sig Start Date End Date Taking? Authorizing Provider  FLUoxetine (PROZAC) 20 MG capsule Take 20 mg by mouth daily.    [provider]  hydrOXYzine (ATARAX/VISTARIL) 25 MG tablet Take 1/2 to 1 Tablet By mouth 2 times per day Take for anxiety, panic, agitation, and sleep as needed 03/16/21     Vitamin D, Ergocalciferol, (DRISDOL) 1.25 MG (50000 UNIT) CAPS capsule Take 1 capsule (50,000 Units total) by mouth every 7 (seven) days. 03/11/21   Thomasene Lot, DO    Family History Family History  Adopted: Yes  Problem Relation Age of Onset   Healthy Mother    Healthy Father     Social History Social History   Tobacco Use   Smoking status: Never   Smokeless tobacco: Never  Vaping Use   Vaping Use: Never used  Substance Use Topics   Alcohol use: Never   Drug use: Never  Allergies   Bee venom   Review of Systems Review of Systems  As stated above in HPI Physical Exam Triage Vital Signs ED Triage Vitals  Enc Vitals Group     BP 05/29/21 1650 125/86     Pulse Rate 05/29/21 1650 91     Resp 05/29/21 1650 18     Temp 05/29/21 1650 99.1 F (37.3 C)     Temp src --      SpO2 05/29/21 1650 98 %     Weight --      Height --      Head Circumference --      Peak Flow --      Pain Score 05/29/21 1648 6     Pain Loc --      Pain Edu? --      Excl. in GC? --    No data found.  Updated Vital Signs BP 125/86   Pulse 91   Temp 99.1 F (37.3 C)   Resp 18   SpO2 98%   Physical Exam Vitals and nursing note reviewed.  Constitutional:      General: She is not in acute distress.    Appearance: Normal appearance. She is obese. She is not ill-appearing, toxic-appearing or diaphoretic.  HENT:     Head: Normocephalic and atraumatic.     Mouth/Throat:     Mouth: Mucous membranes are moist.   Cardiovascular:     Rate and Rhythm: Normal rate and regular rhythm.     Pulses: Normal pulses.     Heart sounds: Normal heart sounds.  Pulmonary:     Effort: Pulmonary effort is normal.     Breath sounds: Normal breath sounds.  Abdominal:     General: Bowel sounds are normal. There is no distension.     Palpations: Abdomen is soft. There is no mass.     Tenderness: There is no abdominal tenderness. There is no right CVA tenderness, left CVA tenderness, guarding or rebound.     Hernia: No hernia is present.  Musculoskeletal:     Cervical back: Neck supple.  Lymphadenopathy:     Cervical: No cervical adenopathy.  Skin:    General: Skin is warm.  Neurological:     Mental Status: She is alert and oriented to person, place, and time.     UC Treatments / Results  Labs (all labs ordered are listed, but only abnormal results are displayed) Labs Reviewed  POCT URINALYSIS DIPSTICK, ED / UC - Abnormal; Notable for the following components:      Result Value   Hgb urine dipstick TRACE (*)    Leukocytes,Ua TRACE (*)    All other components within normal limits  POC URINE PREG, ED  CERVICOVAGINAL ANCILLARY ONLY    EKG   Radiology No results found.  Procedures Procedures (including critical care time)  Medications Ordered in UC Medications - No data to display  Initial Impression / Assessment and Plan / UC Course  I have reviewed the triage vital signs and the nursing notes.  Pertinent labs & imaging results that were available during my care of the patient were reviewed by me and considered in my medical decision making (see chart for details).     New.  Appears to be acute cystitis with hematuria.  Treating with Macrobid while we await urine culture and cytology results to prevent further systemic complication.  Discussed red flag signs and symptoms and encouraged her to stay hydrated with water. Final Clinical Impressions(s) /  UC Diagnoses   Final diagnoses:  None    Discharge Instructions   None    ED Prescriptions   None    PDMP not reviewed this encounter.   Rushie Chestnut, [place] 05/29/21 1750

## 2021-05-31 LAB — URINE CULTURE

## 2021-05-31 LAB — CERVICOVAGINAL ANCILLARY ONLY
Bacterial Vaginitis (gardnerella): POSITIVE — AB
Candida Glabrata: NEGATIVE
Candida Vaginitis: POSITIVE — AB
Chlamydia: NEGATIVE
Comment: NEGATIVE
Comment: NEGATIVE
Comment: NEGATIVE
Comment: NEGATIVE
Comment: NEGATIVE
Comment: NORMAL
Neisseria Gonorrhea: NEGATIVE
Trichomonas: NEGATIVE

## 2021-06-01 ENCOUNTER — Telehealth (HOSPITAL_COMMUNITY): Payer: Self-pay | Admitting: Emergency Medicine

## 2021-06-01 MED ORDER — METRONIDAZOLE 500 MG PO TABS: 500.0000 mg | ORAL_TABLET | Freq: Two times a day (BID) | ORAL | 0 refills | Status: AC

## 2021-06-01 MED ORDER — FLUCONAZOLE 150 MG PO TABS: 150.0000 mg | ORAL_TABLET | Freq: Once | ORAL | 0 refills | Status: AC

## 2021-06-16 ENCOUNTER — Telehealth: Payer: Self-pay | Admitting: Nurse Practitioner

## 2021-06-16 NOTE — Telephone Encounter (Signed)
Copied from CRM [id number]. Topic: General - Other >> Jun 08, 2021  8:45 AM Elliot Gault wrote: Reason for CRM:  patient unable to load her past appointments to view her 12/14/2020 physical appointment. Patient would like PCP nurse to attach 12/14/2020 physical office notes and a snap shot of her immunizations and send through My Chart has a message. Patient is starting a new job  Clled pt x2 LVM informing pt to come into [facility] to do a release of information and make sure to bring her ID and then the information can be giving to her and she should call [facility]  about not being able to see past information and I left office number if she had any other questions

## 2022-05-18 ENCOUNTER — Encounter (INDEPENDENT_AMBULATORY_CARE_PROVIDER_SITE_OTHER): Payer: Self-pay
